# Patient Record
Sex: Female | Born: 1945 | Race: Black or African American | Hispanic: No | Marital: Married | Smoking: Former smoker
Health system: Southern US, Community
[De-identification: ages and names within clinical notes are randomized; demographics above are authoritative.]

## PROBLEM LIST (undated history)

## (undated) DIAGNOSIS — E039 Hypothyroidism, unspecified: Secondary | ICD-10-CM

## (undated) DIAGNOSIS — I499 Cardiac arrhythmia, unspecified: Secondary | ICD-10-CM

## (undated) DIAGNOSIS — J45909 Unspecified asthma, uncomplicated: Secondary | ICD-10-CM

## (undated) DIAGNOSIS — M1990 Unspecified osteoarthritis, unspecified site: Secondary | ICD-10-CM

## (undated) DIAGNOSIS — E119 Type 2 diabetes mellitus without complications: Secondary | ICD-10-CM

## (undated) HISTORY — PX: TOE SURGERY: SHX1073

## (undated) HISTORY — PX: AXILLARY SURGERY: SHX892

## (undated) HISTORY — PX: BUNIONECTOMY: SHX129

## (undated) HISTORY — PX: DG STYLOID PROCESS / TEMPORAL BONES: HXRAD918

## (undated) HISTORY — PX: SKIN GRAFT: SHX250

## (undated) HISTORY — PX: CYSTECTOMY: SUR359

## (undated) HISTORY — PX: OTHER SURGICAL HISTORY: SHX169

---

## 1996-08-13 HISTORY — PX: ABDOMINAL HYSTERECTOMY: SHX81

## 2000-12-07 ENCOUNTER — Encounter: Payer: Self-pay | Admitting: Internal Medicine

## 2000-12-07 ENCOUNTER — Emergency Department (HOSPITAL_COMMUNITY): Admission: EM | Admit: 2000-12-07 | Discharge: 2000-12-07 | Payer: Self-pay | Admitting: Internal Medicine

## 2002-10-17 ENCOUNTER — Ambulatory Visit: Admission: RE | Admit: 2002-10-17 | Discharge: 2002-10-17 | Payer: Self-pay | Admitting: Internal Medicine

## 2002-10-17 ENCOUNTER — Encounter: Payer: Self-pay | Admitting: Internal Medicine

## 2003-01-19 ENCOUNTER — Ambulatory Visit (HOSPITAL_COMMUNITY): Admission: RE | Admit: 2003-01-19 | Discharge: 2003-01-19 | Payer: Self-pay | Admitting: Internal Medicine

## 2003-01-19 ENCOUNTER — Encounter: Payer: Self-pay | Admitting: Internal Medicine

## 2003-11-13 ENCOUNTER — Emergency Department (HOSPITAL_COMMUNITY): Admission: EM | Admit: 2003-11-13 | Discharge: 2003-11-13 | Payer: Self-pay | Admitting: Emergency Medicine

## 2005-02-20 ENCOUNTER — Ambulatory Visit (HOSPITAL_COMMUNITY): Admission: RE | Admit: 2005-02-20 | Discharge: 2005-02-20 | Payer: Self-pay | Admitting: Internal Medicine

## 2005-02-26 ENCOUNTER — Emergency Department (HOSPITAL_COMMUNITY): Admission: EM | Admit: 2005-02-26 | Discharge: 2005-02-26 | Payer: Self-pay | Admitting: Emergency Medicine

## 2006-04-02 ENCOUNTER — Encounter (HOSPITAL_COMMUNITY): Admission: RE | Admit: 2006-04-02 | Discharge: 2006-05-02 | Payer: Self-pay | Admitting: Endocrinology

## 2008-11-09 ENCOUNTER — Ambulatory Visit (HOSPITAL_COMMUNITY): Admission: RE | Admit: 2008-11-09 | Discharge: 2008-11-09 | Payer: Self-pay | Admitting: Internal Medicine

## 2010-09-03 ENCOUNTER — Encounter: Payer: Self-pay | Admitting: Endocrinology

## 2010-10-31 ENCOUNTER — Other Ambulatory Visit (HOSPITAL_COMMUNITY): Payer: Self-pay | Admitting: Internal Medicine

## 2010-10-31 ENCOUNTER — Ambulatory Visit (HOSPITAL_COMMUNITY)
Admission: RE | Admit: 2010-10-31 | Discharge: 2010-10-31 | Disposition: A | Discharge status: Home / Self Care | Payer: BC Managed Care – PPO | Source: Ambulatory Visit | Attending: Internal Medicine | Admitting: Internal Medicine

## 2010-10-31 DIAGNOSIS — M25561 Pain in right knee: Secondary | ICD-10-CM

## 2010-10-31 DIAGNOSIS — Z139 Encounter for screening, unspecified: Secondary | ICD-10-CM

## 2010-10-31 DIAGNOSIS — M25569 Pain in unspecified knee: Secondary | ICD-10-CM | POA: Insufficient documentation

## 2010-10-31 DIAGNOSIS — S8990XA Unspecified injury of unspecified lower leg, initial encounter: Secondary | ICD-10-CM | POA: Insufficient documentation

## 2010-10-31 DIAGNOSIS — X500XXA Overexertion from strenuous movement or load, initial encounter: Secondary | ICD-10-CM | POA: Insufficient documentation

## 2010-11-10 ENCOUNTER — Ambulatory Visit (HOSPITAL_COMMUNITY)
Admission: RE | Admit: 2010-11-10 | Discharge: 2010-11-10 | Disposition: A | Discharge status: Home / Self Care | Payer: BC Managed Care – PPO | Source: Ambulatory Visit | Attending: Internal Medicine | Admitting: Internal Medicine

## 2010-11-10 DIAGNOSIS — Z1231 Encounter for screening mammogram for malignant neoplasm of breast: Secondary | ICD-10-CM | POA: Insufficient documentation

## 2010-11-10 DIAGNOSIS — Z139 Encounter for screening, unspecified: Secondary | ICD-10-CM

## 2012-08-11 ENCOUNTER — Other Ambulatory Visit (HOSPITAL_COMMUNITY): Payer: Self-pay | Admitting: Internal Medicine

## 2012-08-11 DIAGNOSIS — Z139 Encounter for screening, unspecified: Secondary | ICD-10-CM

## 2012-08-18 ENCOUNTER — Ambulatory Visit (HOSPITAL_COMMUNITY)
Admission: RE | Admit: 2012-08-18 | Discharge: 2012-08-18 | Disposition: A | Discharge status: Home / Self Care | Payer: BC Managed Care – PPO | Source: Ambulatory Visit | Attending: Internal Medicine | Admitting: Internal Medicine

## 2012-08-18 DIAGNOSIS — Z139 Encounter for screening, unspecified: Secondary | ICD-10-CM

## 2012-08-18 DIAGNOSIS — Z1231 Encounter for screening mammogram for malignant neoplasm of breast: Secondary | ICD-10-CM | POA: Insufficient documentation

## 2012-08-22 DIAGNOSIS — H353290 Exudative age-related macular degeneration, unspecified eye, stage unspecified: Secondary | ICD-10-CM | POA: Diagnosis not present

## 2012-09-11 ENCOUNTER — Encounter (INDEPENDENT_AMBULATORY_CARE_PROVIDER_SITE_OTHER): Payer: BC Managed Care – PPO | Admitting: Ophthalmology

## 2012-09-11 DIAGNOSIS — I10 Essential (primary) hypertension: Secondary | ICD-10-CM

## 2012-09-11 DIAGNOSIS — H35379 Puckering of macula, unspecified eye: Secondary | ICD-10-CM

## 2012-09-11 DIAGNOSIS — H2510 Age-related nuclear cataract, unspecified eye: Secondary | ICD-10-CM

## 2012-09-11 DIAGNOSIS — E1139 Type 2 diabetes mellitus with other diabetic ophthalmic complication: Secondary | ICD-10-CM

## 2012-09-11 DIAGNOSIS — E11319 Type 2 diabetes mellitus with unspecified diabetic retinopathy without macular edema: Secondary | ICD-10-CM

## 2012-09-11 DIAGNOSIS — H43819 Vitreous degeneration, unspecified eye: Secondary | ICD-10-CM

## 2012-09-11 DIAGNOSIS — H35039 Hypertensive retinopathy, unspecified eye: Secondary | ICD-10-CM

## 2012-11-30 HISTORY — PX: EYE SURGERY: SHX253

## 2013-01-09 ENCOUNTER — Ambulatory Visit (INDEPENDENT_AMBULATORY_CARE_PROVIDER_SITE_OTHER): Payer: Medicare PPO | Admitting: Ophthalmology

## 2013-01-09 DIAGNOSIS — I10 Essential (primary) hypertension: Secondary | ICD-10-CM

## 2013-01-09 DIAGNOSIS — H35379 Puckering of macula, unspecified eye: Secondary | ICD-10-CM

## 2013-01-09 DIAGNOSIS — E1139 Type 2 diabetes mellitus with other diabetic ophthalmic complication: Secondary | ICD-10-CM

## 2013-01-09 DIAGNOSIS — E11319 Type 2 diabetes mellitus with unspecified diabetic retinopathy without macular edema: Secondary | ICD-10-CM

## 2013-01-09 DIAGNOSIS — H35039 Hypertensive retinopathy, unspecified eye: Secondary | ICD-10-CM

## 2013-01-09 DIAGNOSIS — E1165 Type 2 diabetes mellitus with hyperglycemia: Secondary | ICD-10-CM

## 2013-01-09 DIAGNOSIS — H43819 Vitreous degeneration, unspecified eye: Secondary | ICD-10-CM

## 2013-01-09 NOTE — H&P (Signed)
Cheyenne Benson is an 67 y.o. female.   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  No past surgical history on file.  No family history on file. Social History:  has no tobacco, alcohol, and drug history on file.  Allergies: Allergies not on file  No prescriptions prior to admission    Review of systems otherwise negative  There were no vitals taken for this visit.  Physical exam: Mental status: oriented x3. Eyes: See eye exam associated with this date of surgery in media tab.  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

## 2013-01-12 ENCOUNTER — Encounter (HOSPITAL_COMMUNITY): Payer: Self-pay | Admitting: Pharmacy Technician

## 2013-01-19 ENCOUNTER — Encounter (HOSPITAL_COMMUNITY): Payer: Self-pay | Admitting: *Deleted

## 2013-01-21 MED ORDER — CYCLOPENTOLATE HCL 1 % OP SOLN
1.0000 [drp] | OPHTHALMIC | Status: AC | PRN
  Administered 2013-01-22 (×3): 1 [drp] via OPHTHALMIC
  Filled 2013-01-21: qty 2

## 2013-01-21 MED ORDER — PHENYLEPHRINE HCL 2.5 % OP SOLN
1.0000 [drp] | OPHTHALMIC | Status: AC | PRN
  Administered 2013-01-22 (×2): 1 [drp] via OPHTHALMIC

## 2013-01-21 MED ORDER — CEFAZOLIN SODIUM-DEXTROSE 2-3 GM-% IV SOLR
2.0000 g | INTRAVENOUS | Status: AC
  Administered 2013-01-22: 2 g via INTRAVENOUS
  Filled 2013-01-21: qty 50

## 2013-01-21 MED ORDER — GATIFLOXACIN 0.5 % OP SOLN
1.0000 [drp] | OPHTHALMIC | Status: AC | PRN
  Administered 2013-01-22 (×3): 1 [drp] via OPHTHALMIC
  Filled 2013-01-21: qty 2.5

## 2013-01-21 MED ORDER — TROPICAMIDE 1 % OP SOLN
1.0000 [drp] | OPHTHALMIC | Status: AC | PRN
  Administered 2013-01-22 (×3): 1 [drp] via OPHTHALMIC
  Filled 2013-01-21: qty 3

## 2013-01-22 ENCOUNTER — Ambulatory Visit (HOSPITAL_COMMUNITY): Payer: Medicare PPO | Admitting: Anesthesiology

## 2013-01-22 ENCOUNTER — Encounter (HOSPITAL_COMMUNITY): Payer: Self-pay | Admitting: Anesthesiology

## 2013-01-22 ENCOUNTER — Ambulatory Visit (HOSPITAL_COMMUNITY): Payer: Medicare PPO

## 2013-01-22 ENCOUNTER — Encounter (HOSPITAL_COMMUNITY)
Admission: RE | Disposition: A | Discharge status: Home / Self Care | Payer: Self-pay | Source: Ambulatory Visit | Attending: Ophthalmology

## 2013-01-22 ENCOUNTER — Ambulatory Visit (HOSPITAL_COMMUNITY)
Admission: RE | Admit: 2013-01-22 | Discharge: 2013-01-23 | Disposition: A | Discharge status: Home / Self Care | Payer: Medicare PPO | Source: Ambulatory Visit | Attending: Ophthalmology | Admitting: Ophthalmology

## 2013-01-22 DIAGNOSIS — H35379 Puckering of macula, unspecified eye: Secondary | ICD-10-CM

## 2013-01-22 DIAGNOSIS — H35349 Macular cyst, hole, or pseudohole, unspecified eye: Secondary | ICD-10-CM

## 2013-01-22 DIAGNOSIS — H35341 Macular cyst, hole, or pseudohole, right eye: Secondary | ICD-10-CM

## 2013-01-22 DIAGNOSIS — H35371 Puckering of macula, right eye: Secondary | ICD-10-CM | POA: Diagnosis present

## 2013-01-22 DIAGNOSIS — E119 Type 2 diabetes mellitus without complications: Secondary | ICD-10-CM | POA: Insufficient documentation

## 2013-01-22 HISTORY — PX: MEMBRANE PEEL: SHX5967

## 2013-01-22 HISTORY — PX: 25 GAUGE PARS PLANA VITRECTOMY WITH 20 GAUGE MVR PORT FOR MACULAR HOLE: SHX6096

## 2013-01-22 HISTORY — PX: SERUM PATCH: SHX6091

## 2013-01-22 HISTORY — PX: GAS/FLUID EXCHANGE: SHX5334

## 2013-01-22 HISTORY — DX: Cardiac arrhythmia, unspecified: I49.9

## 2013-01-22 HISTORY — DX: Type 2 diabetes mellitus without complications: E11.9

## 2013-01-22 HISTORY — DX: Hypothyroidism, unspecified: E03.9

## 2013-01-22 HISTORY — PX: LASER PHOTO ABLATION: SHX5942

## 2013-01-22 HISTORY — DX: Unspecified asthma, uncomplicated: J45.909

## 2013-01-22 HISTORY — DX: Unspecified osteoarthritis, unspecified site: M19.90

## 2013-01-22 LAB — CBC
Hemoglobin: 12.3 g/dL (ref 12.0–15.0)
MCHC: 34.6 g/dL (ref 30.0–36.0)
Platelets: 257 10*3/uL (ref 150–400)
RDW: 16.4 % — ABNORMAL HIGH (ref 11.5–15.5)

## 2013-01-22 LAB — GLUCOSE, CAPILLARY
Glucose-Capillary: 99 mg/dL (ref 70–99)
Glucose-Capillary: 111 mg/dL — ABNORMAL HIGH (ref 70–99)
Glucose-Capillary: 139 mg/dL — ABNORMAL HIGH (ref 70–99)

## 2013-01-22 LAB — BASIC METABOLIC PANEL
GFR calc Af Amer: >90 mL/min (ref >90)
GFR calc non Af Amer: 89 mL/min — ABNORMAL LOW (ref >90)
Glucose, Bld: 113 mg/dL — ABNORMAL HIGH (ref 70–99)
Potassium: 3.8 mEq/L (ref 3.5–5.1)
Sodium: 139 mEq/L (ref 135–145)

## 2013-01-22 LAB — SURGICAL PCR SCREEN
MRSA, PCR: NEGATIVE
Staphylococcus aureus: NEGATIVE

## 2013-01-22 SURGERY — 25 GAUGE PARS PLANA VITRECTOMY WITH 20 GAUGE MVR PORT FOR MACULAR HOLE
Anesthesia: General | Site: Eye | Laterality: Right

## 2013-01-22 MED ORDER — ALBUTEROL SULFATE HFA 108 (90 BASE) MCG/ACT IN AERS: 2.0000 | INHALATION_SPRAY | Freq: Four times a day (QID) | RESPIRATORY_TRACT | Status: DC | PRN

## 2013-01-22 MED ORDER — EPHEDRINE SULFATE 50 MG/ML IJ SOLN
INTRAMUSCULAR | Status: DC | PRN
  Administered 2013-01-22: 15 mg via INTRAVENOUS
  Administered 2013-01-22 (×2): 10 mg via INTRAVENOUS
  Administered 2013-01-22: 15 mg via INTRAVENOUS
  Administered 2013-01-22: 10 mg via INTRAVENOUS

## 2013-01-22 MED ORDER — BSS IO SOLN
INTRAOCULAR | Status: AC
  Filled 2013-01-22: qty 15

## 2013-01-22 MED ORDER — ONDANSETRON HCL 4 MG/2ML IJ SOLN
INTRAMUSCULAR | Status: DC | PRN
  Administered 2013-01-22: 4 mg via INTRAVENOUS

## 2013-01-22 MED ORDER — SODIUM HYALURONATE 10 MG/ML IO SOLN
INTRAOCULAR | Status: DC | PRN
  Administered 2013-01-22: 0.85 mL via INTRAOCULAR

## 2013-01-22 MED ORDER — ACETAZOLAMIDE SODIUM 500 MG IJ SOLR
INTRAMUSCULAR | Status: AC
  Filled 2013-01-22: qty 500

## 2013-01-22 MED ORDER — BRIMONIDINE TARTRATE 0.2 % OP SOLN
1.0000 [drp] | Freq: Two times a day (BID) | OPHTHALMIC | Status: DC
  Filled 2013-01-22 (×2): qty 5

## 2013-01-22 MED ORDER — PHENYLEPHRINE HCL 2.5 % OP SOLN
OPHTHALMIC | Status: AC
  Administered 2013-01-22: 1 [drp]
  Filled 2013-01-22: qty 2

## 2013-01-22 MED ORDER — ATROPINE SULFATE 1 % OP SOLN
OPHTHALMIC | Status: AC
  Filled 2013-01-22: qty 2

## 2013-01-22 MED ORDER — PHENYLEPHRINE HCL 10 MG/ML IJ SOLN
INTRAMUSCULAR | Status: DC | PRN
  Administered 2013-01-22: 80 ug via INTRAVENOUS

## 2013-01-22 MED ORDER — ROCURONIUM BROMIDE 100 MG/10ML IV SOLN
INTRAVENOUS | Status: DC | PRN
  Administered 2013-01-22: 50 mg via INTRAVENOUS

## 2013-01-22 MED ORDER — INSULIN ASPART 100 UNIT/ML ~~LOC~~ SOLN: 0.0000 [IU] | SUBCUTANEOUS | Status: DC

## 2013-01-22 MED ORDER — HYALURONIDASE HUMAN 150 UNIT/ML IJ SOLN
INTRAMUSCULAR | Status: AC
  Filled 2013-01-22: qty 1

## 2013-01-22 MED ORDER — METFORMIN HCL 500 MG PO TABS
500.0000 mg | ORAL_TABLET | Freq: Two times a day (BID) | ORAL | Status: DC
  Administered 2013-01-22: 500 mg via ORAL
  Filled 2013-01-22 (×3): qty 1

## 2013-01-22 MED ORDER — PHENTERMINE-TOPIRAMATE ER 7.5-46 MG PO CP24: 1.0000 | ORAL_CAPSULE | Freq: Every day | ORAL | Status: DC

## 2013-01-22 MED ORDER — LATANOPROST 0.005 % OP SOLN
1.0000 [drp] | Freq: Every day | OPHTHALMIC | Status: DC
  Filled 2013-01-22 (×3): qty 2.5

## 2013-01-22 MED ORDER — SODIUM CHLORIDE 0.9 % IJ SOLN
INTRAMUSCULAR | Status: DC | PRN
  Administered 2013-01-22: 13:00:00

## 2013-01-22 MED ORDER — METOPROLOL TARTRATE 25 MG PO TABS
25.0000 mg | ORAL_TABLET | Freq: Every day | ORAL | Status: DC
  Filled 2013-01-22: qty 1

## 2013-01-22 MED ORDER — BACITRACIN-POLYMYXIN B 500-10000 UNIT/GM OP OINT
1.0000 "application " | TOPICAL_OINTMENT | Freq: Four times a day (QID) | OPHTHALMIC | Status: DC
  Filled 2013-01-22: qty 3.5

## 2013-01-22 MED ORDER — POLYMYXIN B SULFATE 500000 UNITS IJ SOLR
INTRAMUSCULAR | Status: AC
  Filled 2013-01-22: qty 1

## 2013-01-22 MED ORDER — DOCUSATE SODIUM 100 MG PO CAPS
100.0000 mg | ORAL_CAPSULE | Freq: Two times a day (BID) | ORAL | Status: DC
  Administered 2013-01-22: 100 mg via ORAL
  Filled 2013-01-22: qty 1

## 2013-01-22 MED ORDER — HYDROCODONE-ACETAMINOPHEN 5-325 MG PO TABS: 1.0000 | ORAL_TABLET | ORAL | Status: DC | PRN

## 2013-01-22 MED ORDER — HYDROCHLOROTHIAZIDE 12.5 MG PO CAPS
12.5000 mg | ORAL_CAPSULE | Freq: Every day | ORAL | Status: DC
  Administered 2013-01-22: 12.5 mg via ORAL
  Filled 2013-01-22 (×2): qty 1

## 2013-01-22 MED ORDER — NEOSTIGMINE METHYLSULFATE 1 MG/ML IJ SOLN
INTRAMUSCULAR | Status: DC | PRN
  Administered 2013-01-22: 4 mg via INTRAVENOUS

## 2013-01-22 MED ORDER — SODIUM HYALURONATE 10 MG/ML IO SOLN
INTRAOCULAR | Status: AC
  Filled 2013-01-22: qty 0.85

## 2013-01-22 MED ORDER — ATROPINE SULFATE 1 % OP SOLN
OPHTHALMIC | Status: DC | PRN
  Administered 2013-01-22: 2 [drp] via OPHTHALMIC

## 2013-01-22 MED ORDER — MUPIROCIN 2 % EX OINT
TOPICAL_OINTMENT | CUTANEOUS | Status: AC
  Filled 2013-01-22: qty 22

## 2013-01-22 MED ORDER — 0.9 % SODIUM CHLORIDE (POUR BTL) OPTIME
TOPICAL | Status: DC | PRN
  Administered 2013-01-22: 1000 mL

## 2013-01-22 MED ORDER — HYDROMORPHONE HCL PF 1 MG/ML IJ SOLN
0.2500 mg | INTRAMUSCULAR | Status: DC | PRN
  Administered 2013-01-22: 0.5 mg via INTRAVENOUS
  Administered 2013-01-22: 0.25 mg via INTRAVENOUS

## 2013-01-22 MED ORDER — GLYCOPYRROLATE 0.2 MG/ML IJ SOLN
INTRAMUSCULAR | Status: DC | PRN
  Administered 2013-01-22: 0.2 mg via INTRAVENOUS
  Administered 2013-01-22: .8 mg via INTRAVENOUS

## 2013-01-22 MED ORDER — MORPHINE SULFATE 2 MG/ML IJ SOLN
1.0000 mg | INTRAMUSCULAR | Status: DC | PRN
  Administered 2013-01-22: 1 mg via INTRAVENOUS
  Filled 2013-01-22: qty 1

## 2013-01-22 MED ORDER — BACITRACIN-POLYMYXIN B 500-10000 UNIT/GM OP OINT
TOPICAL_OINTMENT | OPHTHALMIC | Status: DC | PRN
  Administered 2013-01-22: 1 via OPHTHALMIC

## 2013-01-22 MED ORDER — OXYCODONE HCL 5 MG/5ML PO SOLN: 5.0000 mg | Freq: Once | ORAL | Status: DC | PRN

## 2013-01-22 MED ORDER — MOMETASONE FURO-FORMOTEROL FUM 100-5 MCG/ACT IN AERO
2.0000 | INHALATION_SPRAY | Freq: Two times a day (BID) | RESPIRATORY_TRACT | Status: DC
  Filled 2013-01-22: qty 8.8

## 2013-01-22 MED ORDER — SODIUM CHLORIDE 0.45 % IV SOLN
INTRAVENOUS | Status: DC
  Administered 2013-01-22: 20 mL via INTRAVENOUS

## 2013-01-22 MED ORDER — EPINEPHRINE HCL 1 MG/ML IJ SOLN
INTRAMUSCULAR | Status: AC
  Filled 2013-01-22: qty 1

## 2013-01-22 MED ORDER — MEPERIDINE HCL 25 MG/ML IJ SOLN: 6.2500 mg | INTRAMUSCULAR | Status: DC | PRN

## 2013-01-22 MED ORDER — MINERAL OIL LIGHT 100 % EX OIL
TOPICAL_OIL | CUTANEOUS | Status: AC
  Filled 2013-01-22: qty 25

## 2013-01-22 MED ORDER — LISINOPRIL 10 MG PO TABS
10.0000 mg | ORAL_TABLET | Freq: Every day | ORAL | Status: DC
  Administered 2013-01-22: 10 mg via ORAL
  Filled 2013-01-22 (×2): qty 1

## 2013-01-22 MED ORDER — PROPOFOL 10 MG/ML IV BOLUS
INTRAVENOUS | Status: DC | PRN
  Administered 2013-01-22: 150 mg via INTRAVENOUS

## 2013-01-22 MED ORDER — LISINOPRIL-HYDROCHLOROTHIAZIDE 10-12.5 MG PO TABS: 1.0000 | ORAL_TABLET | Freq: Every day | ORAL | Status: DC

## 2013-01-22 MED ORDER — BUPIVACAINE HCL (PF) 0.75 % IJ SOLN
INTRAMUSCULAR | Status: AC
  Filled 2013-01-22: qty 10

## 2013-01-22 MED ORDER — LIDOCAINE HCL (CARDIAC) 20 MG/ML IV SOLN
INTRAVENOUS | Status: DC | PRN
  Administered 2013-01-22: 100 mg via INTRAVENOUS

## 2013-01-22 MED ORDER — ACETAZOLAMIDE SODIUM 500 MG IJ SOLR
500.0000 mg | Freq: Once | INTRAMUSCULAR | Status: AC
  Administered 2013-01-23: 500 mg via INTRAVENOUS
  Filled 2013-01-22: qty 500

## 2013-01-22 MED ORDER — EPINEPHRINE HCL 1 MG/ML IJ SOLN
INTRAOCULAR | Status: DC | PRN
  Administered 2013-01-22: 13:00:00

## 2013-01-22 MED ORDER — ONDANSETRON HCL 4 MG/2ML IJ SOLN: 4.0000 mg | Freq: Four times a day (QID) | INTRAMUSCULAR | Status: DC | PRN

## 2013-01-22 MED ORDER — MUPIROCIN 2 % EX OINT
TOPICAL_OINTMENT | Freq: Two times a day (BID) | CUTANEOUS | Status: DC
  Administered 2013-01-22: 09:00:00 via NASAL

## 2013-01-22 MED ORDER — GATIFLOXACIN 0.5 % OP SOLN
1.0000 [drp] | Freq: Four times a day (QID) | OPHTHALMIC | Status: DC
  Filled 2013-01-22 (×2): qty 2.5

## 2013-01-22 MED ORDER — PREDNISOLONE ACETATE 1 % OP SUSP
1.0000 [drp] | Freq: Four times a day (QID) | OPHTHALMIC | Status: DC
  Filled 2013-01-22: qty 5
  Filled 2013-01-22: qty 1

## 2013-01-22 MED ORDER — OXYCODONE HCL 5 MG PO TABS: 5.0000 mg | ORAL_TABLET | Freq: Once | ORAL | Status: DC | PRN

## 2013-01-22 MED ORDER — DEXAMETHASONE SODIUM PHOSPHATE 10 MG/ML IJ SOLN
INTRAMUSCULAR | Status: AC
  Filled 2013-01-22: qty 1

## 2013-01-22 MED ORDER — FENTANYL CITRATE 0.05 MG/ML IJ SOLN
INTRAMUSCULAR | Status: DC | PRN
  Administered 2013-01-22: 150 ug via INTRAVENOUS

## 2013-01-22 MED ORDER — HYDROMORPHONE HCL PF 1 MG/ML IJ SOLN
INTRAMUSCULAR | Status: AC
  Filled 2013-01-22: qty 1

## 2013-01-22 MED ORDER — LIDOCAINE HCL 2 % IJ SOLN
INTRAMUSCULAR | Status: AC
  Filled 2013-01-22: qty 20

## 2013-01-22 MED ORDER — HYPROMELLOSE (GONIOSCOPIC) 2.5 % OP SOLN
OPHTHALMIC | Status: AC
  Filled 2013-01-22: qty 15

## 2013-01-22 MED ORDER — TETRACAINE HCL 0.5 % OP SOLN
2.0000 [drp] | Freq: Once | OPHTHALMIC | Status: DC
  Filled 2013-01-22 (×2): qty 2

## 2013-01-22 MED ORDER — GENTAMICIN SULFATE 40 MG/ML IJ SOLN
INTRAMUSCULAR | Status: AC
  Filled 2013-01-22: qty 2

## 2013-01-22 MED ORDER — DEXAMETHASONE SODIUM PHOSPHATE 10 MG/ML IJ SOLN
INTRAMUSCULAR | Status: DC | PRN
  Administered 2013-01-22: 10 mg

## 2013-01-22 MED ORDER — ONDANSETRON HCL 4 MG/2ML IJ SOLN: 4.0000 mg | Freq: Once | INTRAMUSCULAR | Status: DC | PRN

## 2013-01-22 MED ORDER — SODIUM CHLORIDE 0.9 % IV SOLN
INTRAVENOUS | Status: DC | PRN
  Administered 2013-01-22 (×2): via INTRAVENOUS

## 2013-01-22 MED ORDER — TRIAMCINOLONE ACETONIDE 40 MG/ML IJ SUSP
INTRAMUSCULAR | Status: AC
  Filled 2013-01-22: qty 5

## 2013-01-22 MED ORDER — ATORVASTATIN CALCIUM 20 MG PO TABS
20.0000 mg | ORAL_TABLET | Freq: Every day | ORAL | Status: DC
  Filled 2013-01-22 (×2): qty 1

## 2013-01-22 MED ORDER — ACETAMINOPHEN 325 MG PO TABS: 325.0000 mg | ORAL_TABLET | ORAL | Status: DC | PRN

## 2013-01-22 MED ORDER — BACITRACIN-POLYMYXIN B 500-10000 UNIT/GM OP OINT
TOPICAL_OINTMENT | OPHTHALMIC | Status: AC
  Filled 2013-01-22: qty 3.5

## 2013-01-22 MED ORDER — LEVOTHYROXINE SODIUM 125 MCG PO TABS
125.0000 ug | ORAL_TABLET | Freq: Every day | ORAL | Status: DC
  Filled 2013-01-22: qty 1

## 2013-01-22 MED ORDER — TEMAZEPAM 15 MG PO CAPS: 15.0000 mg | ORAL_CAPSULE | Freq: Every evening | ORAL | Status: DC | PRN

## 2013-01-22 MED ORDER — BUPIVACAINE HCL (PF) 0.75 % IJ SOLN
INTRAMUSCULAR | Status: DC | PRN
  Administered 2013-01-22: 10 mL

## 2013-01-22 MED ORDER — MAGNESIUM HYDROXIDE 400 MG/5ML PO SUSP: 15.0000 mL | Freq: Four times a day (QID) | ORAL | Status: DC | PRN

## 2013-01-22 SURGICAL SUPPLY — 60 items
APPLICATOR DR MATTHEWS STRL (MISCELLANEOUS) IMPLANT
BLADE EYE CATARACT 19 1.4 BEAV (BLADE) IMPLANT
BLADE MVR KNIFE 19G (BLADE) IMPLANT
BLADE MVR KNIFE 20G (BLADE) ×3 IMPLANT
CANNULA ANT CHAM MAIN (OPHTHALMIC RELATED) IMPLANT
CANNULA FLEX TIP 25G (CANNULA) ×3 IMPLANT
CANNULA SUBRETINAL FLUID 20G (BLADE) ×3 IMPLANT
CLOTH BEACON ORANGE TIMEOUT ST (SAFETY) ×3 IMPLANT
CORDS BIPOLAR (ELECTRODE) ×3 IMPLANT
COTTONBALL LRG STERILE PKG (GAUZE/BANDAGES/DRESSINGS) ×9 IMPLANT
DRAPE INCISE 51X51 W/FILM STRL (DRAPES) ×3 IMPLANT
DRAPE OPHTHALMIC 77X100 STRL (CUSTOM PROCEDURE TRAY) ×3 IMPLANT
EAGLE VIT/RET MICRO PIC 168 25 (MISCELLANEOUS) IMPLANT
ERASER HMR WETFIELD 23G BP (MISCELLANEOUS) IMPLANT
FILTER BLUE MILLIPORE (MISCELLANEOUS) ×3 IMPLANT
FILTER STRAW FLUID ASPIR (MISCELLANEOUS) ×3 IMPLANT
GAS OPHTHALMIC (MISCELLANEOUS) ×3 IMPLANT
GLOVE SS BIOGEL STRL SZ 6.5 (GLOVE) ×4 IMPLANT
GLOVE SS BIOGEL STRL SZ 7 (GLOVE) ×2 IMPLANT
GLOVE SUPERSENSE BIOGEL SZ 6.5 (GLOVE) ×2
GLOVE SUPERSENSE BIOGEL SZ 7 (GLOVE) ×1
GLOVE SURG 8.5 LATEX PF (GLOVE) ×3 IMPLANT
GOWN STRL NON-REIN LRG LVL3 (GOWN DISPOSABLE) ×9 IMPLANT
ILLUMINATOR CHOW PICK 25GA (MISCELLANEOUS) ×3 IMPLANT
KIT BASIN OR (CUSTOM PROCEDURE TRAY) ×3 IMPLANT
KIT ROOM TURNOVER OR (KITS) ×3 IMPLANT
KNIFE CRESCENT 1.75 EDGEAHEAD (BLADE) IMPLANT
KNIFE GRIESHABER SHARP 2.5MM (MISCELLANEOUS) IMPLANT
MASK EYE SHIELD (GAUZE/BANDAGES/DRESSINGS) ×3 IMPLANT
NEEDLE 18GX1X1/2 (RX/OR ONLY) (NEEDLE) ×3 IMPLANT
NEEDLE 25GX 5/8IN NON SAFETY (NEEDLE) ×3 IMPLANT
NEEDLE 27GAX1X1/2 (NEEDLE) IMPLANT
NEEDLE HYPO 30X.5 LL (NEEDLE) ×9 IMPLANT
NS IRRIG 1000ML POUR BTL (IV SOLUTION) ×3 IMPLANT
PACK VITRECTOMY CUSTOM (CUSTOM PROCEDURE TRAY) ×3 IMPLANT
PAD ARMBOARD 7.5X6 YLW CONV (MISCELLANEOUS) ×6 IMPLANT
PAD EYE OVAL STERILE LF (GAUZE/BANDAGES/DRESSINGS) ×3 IMPLANT
PAK VITRECTOMY PIK 25 GA (OPHTHALMIC RELATED) IMPLANT
PROBE DIRECTIONAL LASER (MISCELLANEOUS) IMPLANT
REPL STRA BRUSH NEEDLE (NEEDLE) ×3 IMPLANT
RESERVOIR BACK FLUSH (MISCELLANEOUS) ×3 IMPLANT
ROLLS DENTAL (MISCELLANEOUS) ×6 IMPLANT
SCRAPER DIAMOND 25GA (OPHTHALMIC RELATED) IMPLANT
SCRAPER DIAMOND DUST MEMBRANE (MISCELLANEOUS) ×3 IMPLANT
SPONGE SURGIFOAM ABS GEL 12-7 (HEMOSTASIS) ×3 IMPLANT
STOPCOCK 4 WAY LG BORE MALE ST (IV SETS) ×3 IMPLANT
SUT CHROMIC 7 0 TG140 8 (SUTURE) IMPLANT
SUT ETHILON 9 0 TG140 8 (SUTURE) ×3 IMPLANT
SUT SILK 4 0 RB 1 (SUTURE) IMPLANT
SYR 20CC LL (SYRINGE) ×3 IMPLANT
SYR 50ML LL SCALE MARK (SYRINGE) ×3 IMPLANT
SYR 5ML LL (SYRINGE) IMPLANT
SYR BULB 3OZ (MISCELLANEOUS) ×3 IMPLANT
SYR TB 1ML LUER SLIP (SYRINGE) ×3 IMPLANT
SYRINGE 10CC LL (SYRINGE) ×3 IMPLANT
TAPE SURG TRANSPORE 1 IN (GAUZE/BANDAGES/DRESSINGS) ×2 IMPLANT
TAPE SURGICAL TRANSPORE 1 IN (GAUZE/BANDAGES/DRESSINGS) ×1
TOWEL OR 17X24 6PK STRL BLUE (TOWEL DISPOSABLE) ×9 IMPLANT
WATER STERILE IRR 1000ML POUR (IV SOLUTION) ×3 IMPLANT
WIPE INSTRUMENT VISIWIPE 73X73 (MISCELLANEOUS) ×3 IMPLANT

## 2013-01-22 NOTE — Anesthesia Postprocedure Evaluation (Signed)
Anesthesia Post Note  Patient: Cheyenne Benson  Procedure(s) Performed: Procedure(s) (LRB): 25 GAUGE PARS PLANA VITRECTOMY WITH 20 GAUGE MVR PORT FOR MACULAR HOLE RIGHT EYE  (Right) GAS/FLUID EXCHANGE (Right) SERUM PATCH (Right) MEMBRANE PEEL (Right) LASER PHOTO ABLATION (Right)  Anesthesia type: general  Patient location: PACU  Post pain: Pain level controlled  Post assessment: Patient's Cardiovascular Status Stable  Last Vitals:  Filed Vitals:   01/22/13 1400  BP: 112/50  Pulse:   Temp:   Resp: 11    Post vital signs: Reviewed and stable  Level of consciousness: sedated  Complications: No apparent anesthesia complications

## 2013-01-22 NOTE — H&P (Signed)
I examined the patient today and there is no change in the medical status 

## 2013-01-22 NOTE — Transfer of Care (Signed)
Immediate Anesthesia Transfer of Care Note  Patient: Cheyenne Benson  Procedure(s) Performed: Procedure(s) with comments: 25 GAUGE PARS PLANA VITRECTOMY WITH 20 GAUGE MVR PORT FOR MACULAR HOLE RIGHT EYE  (Right) GAS/FLUID EXCHANGE (Right) - C3F8 SERUM PATCH (Right) MEMBRANE PEEL (Right) LASER PHOTO ABLATION (Right) - Headscope  Patient Location: PACU  Anesthesia Type:General  Level of Consciousness: awake, alert  and oriented  Airway & Oxygen Therapy: Patient Spontanous Breathing and Patient connected to nasal cannula oxygen  Post-op Assessment: Report given to PACU RN, Post -op Vital signs reviewed and stable and Patient moving all extremities X 4  Post vital signs: Reviewed and stable  Complications: No apparent anesthesia complications

## 2013-01-22 NOTE — Anesthesia Procedure Notes (Signed)
Procedure Name: Intubation Date/Time: 01/22/2013 11:49 AM Performed by: Carmela Rima Pre-anesthesia Checklist: Patient identified, Emergency Drugs available, Suction available, Patient being monitored and Timeout performed Patient Re-evaluated:Patient Re-evaluated prior to inductionOxygen Delivery Method: Circle system utilized Preoxygenation: Pre-oxygenation with 100% oxygen Intubation Type: IV induction Ventilation: Mask ventilation without difficulty and Oral airway inserted - appropriate to patient size Laryngoscope Size: Mac and 4 Grade View: Grade I Tube type: Oral Tube size: 7.5 mm Number of attempts: 1 Airway Equipment and Method: Stylet Placement Confirmation: ETT inserted through vocal cords under direct vision,  positive ETCO2 and breath sounds checked- equal and bilateral Secured at: 21 cm Tube secured with: Tape Dental Injury: Teeth and Oropharynx as per pre-operative assessment

## 2013-01-22 NOTE — Brief Op Note (Signed)
Brief Operative note   Preoperative diagnosis:  Pre-Op Diagnosis Codes:    * Macular puckering of retina, right [362.56] Postoperative diagnosis  Post-Op Diagnosis Codes:    * Macular puckering of retina, right [362.56]  Procedures: Membrane peel and macular hole repair right eye  Surgeon:  Sherrie George, MD...  Assistant:  Rosalie Doctor SA    Anesthesia: General  Specimen: none  Estimated blood loss:  1cc  Complications: none  Patient sent to PACU in good condition  Composed by Sherrie George MD  Dictation number: [id number]

## 2013-01-22 NOTE — Anesthesia Preprocedure Evaluation (Addendum)
Anesthesia Evaluation  Patient identified by MRN, date of birth, ID band  Reviewed: Allergy & Precautions, H&P , NPO status , Patient's Chart, lab work & pertinent test results, reviewed documented beta blocker date and time   Airway Mallampati: II TM Distance: >3 FB Neck ROM: full    Dental  (+) Teeth Intact and Dental Advidsory Given   Pulmonary asthma ,          Cardiovascular + dysrhythmias Supra Ventricular Tachycardia     Neuro/Psych    GI/Hepatic   Endo/Other  diabetes, Well Controlled, Type 2, Oral Hypoglycemic AgentsHypothyroidism   Renal/GU      Musculoskeletal   Abdominal   Peds  Hematology   Anesthesia Other Findings   Reproductive/Obstetrics                           Anesthesia Physical Anesthesia Plan  ASA: III  Anesthesia Plan:    Post-op Pain Management:    Induction:   Airway Management Planned:   Additional Equipment:   Intra-op Plan:   Post-operative Plan:   Informed Consent:   Dental Advisory Given  Plan Discussed with: Anesthesiologist, CRNA and Surgeon  Anesthesia Plan Comments:        Anesthesia Quick Evaluation

## 2013-01-22 NOTE — Preoperative (Signed)
Beta Blockers   Reason not to administer Beta Blockers:Not Applicable 

## 2013-01-23 DIAGNOSIS — H35371 Puckering of macula, right eye: Secondary | ICD-10-CM | POA: Diagnosis present

## 2013-01-23 DIAGNOSIS — H35349 Macular cyst, hole, or pseudohole, unspecified eye: Secondary | ICD-10-CM | POA: Diagnosis present

## 2013-01-23 LAB — GLUCOSE, CAPILLARY
Glucose-Capillary: 126 mg/dL — ABNORMAL HIGH (ref 70–99)
Glucose-Capillary: 148 mg/dL — ABNORMAL HIGH (ref 70–99)

## 2013-01-23 MED ORDER — BRIMONIDINE TARTRATE 0.2 % OP SOLN: 1.0000 [drp] | Freq: Two times a day (BID) | OPHTHALMIC | Status: DC

## 2013-01-23 MED ORDER — PREDNISOLONE ACETATE 1 % OP SUSP: 1.0000 [drp] | Freq: Four times a day (QID) | OPHTHALMIC | Status: DC

## 2013-01-23 MED ORDER — BACITRACIN-POLYMYXIN B 500-10000 UNIT/GM OP OINT: 1.0000 "application " | TOPICAL_OINTMENT | Freq: Four times a day (QID) | OPHTHALMIC | Status: DC

## 2013-01-23 MED ORDER — GATIFLOXACIN 0.5 % OP SOLN: 1.0000 [drp] | Freq: Four times a day (QID) | OPHTHALMIC | Status: DC

## 2013-01-23 MED ORDER — HYDROCODONE-ACETAMINOPHEN 5-325 MG PO TABS: 1.0000 | ORAL_TABLET | ORAL | Status: DC | PRN

## 2013-01-23 NOTE — Discharge Summary (Signed)
Discharge summary not needed on [facility] patients per medical records. 

## 2013-01-23 NOTE — Op Note (Signed)
Cheyenne Benson, Cheyenne Benson               ACCOUNT NO.:  [id number]  MEDICAL RECORD NO.:  [id number]  LOCATION:  [id number]                        FACILITY:  [facility]  PHYSICIAN:  Beulah Gandy. Ashley Royalty, M.D. DATE OF BIRTH:  12-30-1945  DATE OF PROCEDURE:  01/22/2013 DATE OF DISCHARGE:                              OPERATIVE REPORT   ADMISSION DIAGNOSIS:  Preretinal fibrosis and macular hole, right eye.  PROCEDURES: 1. Pars plana vitrectomy. 2. Retinal photocoagulation. 3. Removal of membrane peel. 4. Serum patch. 5. Gas-fluid exchange, all in the right eye.  SURGEON:  Beulah Gandy. Ashley Royalty, MD  ASSISTANT:  Rosalie Doctor, SA  ANESTHESIA:  General.  DETAILS:  Usual prep and drape, 25-gauge trocars placed at 8 and 10 o'clock with 3-layered MVR incision at 2 o'clock.  Contact lens ring was anchored into place at 6 and 12 o'clock.  Provisc placed on the cornea. The magnifying contact lens was placed.  The pars plana vitrectomy was begun at the pseudophakos.  The vitrectomy was carried posteriorly down to the macular surface where the macula was thrown into folds.  The preretinal membrane was engaged with the [facility] pick with 25-gauge forceps and the lighted pick until it was stripped totally across the macular region.  The diamond-dusted membrane scraper was used to remove portions of the membrane and to straighten the macular region out.  It was apparent that there was a shallow macular hole and therefore macular hole protocols were begun.  The vitrectomy was carried out to the mid periphery where all surface membranes were removed.  The vitrectomy was carried into the far periphery.  With the BIOM viewing system down to the vitreous base for 360 degrees.  The indirect ophthalmoscope laser was moved into place, 685 burns placed around the retinal periphery. The power of 400 mW, 1000 microns each and 0.1 second each.  The macular region was inspected.  It was thrown into folds from the previous membrane.   There was some flattening of folds with the diamond dusted membrane scraper.  A total gas-fluid exchange was performed.  The sufficient time was allowed for additional fluid to track down the walls of the eye and collect in the posterior segment.  Then, additional fluid was removed with a [facility] brush.  Serum patch was delivered. C3F8 12% was exchanged for intravitreal gas.  The instruments were removed from the eye and the wounds were tested.  They were found to be secured.  The scleral wound was closed with one 9-0 nylon suture.  The conjunctiva was closed with wet-field cautery.  Polymyxin and gentamicin were irrigated into tenon space.  Atropine solution was applied. Marcaine was injected around the globe for postop pain.  Closing pressure was 10 with a [place] tonometer.  Polysporin ophthalmic ointment, patch and shield were placed.  The patient is awake and taken to the recovery in satisfactory condition.  COMPLICATIONS:  None.  DURATION:  1 hour 30 minutes.     Beulah Gandy. Ashley Royalty, M.D.     JDM/MEDQ  D:  01/22/2013  T:  01/23/2013  Job:  [id number]

## 2013-01-23 NOTE — Progress Notes (Signed)
DC home with husband, verbally understood DC instructions, no questions asked. 

## 2013-01-23 NOTE — Progress Notes (Signed)
01/23/2013, 6:40 AM  Mental Status:  Awake, Alert, Oriented  Anterior segment: Cornea  Clear    Anterior Chamber Clear    Lens:    IOL  Intra Ocular Pressure 21 mmHg with Tonopen  Vitreous: Clear 95%gas bubble  Retina:  Attached Good laser reaction  Impression: Excellent result Retina attached   Final Diagnosis: Macular hole, macular pucker right eye  Plan: start post operative eye drops.  Discharge to home.  Give post operative instructions  Sherrie George 01/23/2013, 6:40 AM

## 2013-01-27 ENCOUNTER — Encounter (HOSPITAL_COMMUNITY): Payer: Self-pay | Admitting: Ophthalmology

## 2013-01-29 ENCOUNTER — Inpatient Hospital Stay (INDEPENDENT_AMBULATORY_CARE_PROVIDER_SITE_OTHER): Payer: Medicare PPO | Admitting: Ophthalmology

## 2013-01-29 DIAGNOSIS — H35349 Macular cyst, hole, or pseudohole, unspecified eye: Secondary | ICD-10-CM

## 2013-02-19 ENCOUNTER — Encounter (INDEPENDENT_AMBULATORY_CARE_PROVIDER_SITE_OTHER): Payer: Medicare PPO | Admitting: Ophthalmology

## 2013-02-19 DIAGNOSIS — H35349 Macular cyst, hole, or pseudohole, unspecified eye: Secondary | ICD-10-CM

## 2013-03-16 DIAGNOSIS — E018 Other iodine-deficiency related thyroid disorders and allied conditions: Secondary | ICD-10-CM | POA: Diagnosis not present

## 2013-03-16 DIAGNOSIS — E119 Type 2 diabetes mellitus without complications: Secondary | ICD-10-CM | POA: Diagnosis not present

## 2013-03-23 DIAGNOSIS — E018 Other iodine-deficiency related thyroid disorders and allied conditions: Secondary | ICD-10-CM | POA: Diagnosis not present

## 2013-03-23 DIAGNOSIS — E119 Type 2 diabetes mellitus without complications: Secondary | ICD-10-CM | POA: Diagnosis not present

## 2013-03-23 DIAGNOSIS — E669 Obesity, unspecified: Secondary | ICD-10-CM | POA: Diagnosis not present

## 2013-03-23 DIAGNOSIS — E785 Hyperlipidemia, unspecified: Secondary | ICD-10-CM | POA: Diagnosis not present

## 2013-04-30 ENCOUNTER — Encounter (INDEPENDENT_AMBULATORY_CARE_PROVIDER_SITE_OTHER): Payer: Medicare Other | Admitting: Ophthalmology

## 2013-04-30 DIAGNOSIS — E11319 Type 2 diabetes mellitus with unspecified diabetic retinopathy without macular edema: Secondary | ICD-10-CM | POA: Diagnosis not present

## 2013-04-30 DIAGNOSIS — E1139 Type 2 diabetes mellitus with other diabetic ophthalmic complication: Secondary | ICD-10-CM | POA: Diagnosis not present

## 2013-04-30 DIAGNOSIS — H43819 Vitreous degeneration, unspecified eye: Secondary | ICD-10-CM

## 2013-04-30 DIAGNOSIS — H35379 Puckering of macula, unspecified eye: Secondary | ICD-10-CM | POA: Diagnosis not present

## 2013-04-30 DIAGNOSIS — H35039 Hypertensive retinopathy, unspecified eye: Secondary | ICD-10-CM

## 2013-04-30 DIAGNOSIS — I10 Essential (primary) hypertension: Secondary | ICD-10-CM | POA: Diagnosis not present

## 2013-06-15 DIAGNOSIS — E669 Obesity, unspecified: Secondary | ICD-10-CM | POA: Diagnosis not present

## 2013-06-15 DIAGNOSIS — E785 Hyperlipidemia, unspecified: Secondary | ICD-10-CM | POA: Diagnosis not present

## 2013-06-15 DIAGNOSIS — E119 Type 2 diabetes mellitus without complications: Secondary | ICD-10-CM | POA: Diagnosis not present

## 2013-06-15 DIAGNOSIS — E039 Hypothyroidism, unspecified: Secondary | ICD-10-CM | POA: Diagnosis not present

## 2013-06-22 DIAGNOSIS — E785 Hyperlipidemia, unspecified: Secondary | ICD-10-CM | POA: Diagnosis not present

## 2013-06-22 DIAGNOSIS — E018 Other iodine-deficiency related thyroid disorders and allied conditions: Secondary | ICD-10-CM | POA: Diagnosis not present

## 2013-06-22 DIAGNOSIS — E669 Obesity, unspecified: Secondary | ICD-10-CM | POA: Diagnosis not present

## 2013-06-22 DIAGNOSIS — I10 Essential (primary) hypertension: Secondary | ICD-10-CM | POA: Diagnosis not present

## 2013-06-22 DIAGNOSIS — E119 Type 2 diabetes mellitus without complications: Secondary | ICD-10-CM | POA: Diagnosis not present

## 2013-07-16 DIAGNOSIS — J45909 Unspecified asthma, uncomplicated: Secondary | ICD-10-CM | POA: Diagnosis not present

## 2013-07-16 DIAGNOSIS — I10 Essential (primary) hypertension: Secondary | ICD-10-CM | POA: Diagnosis not present

## 2013-07-16 DIAGNOSIS — E119 Type 2 diabetes mellitus without complications: Secondary | ICD-10-CM | POA: Diagnosis not present

## 2013-07-16 DIAGNOSIS — E669 Obesity, unspecified: Secondary | ICD-10-CM | POA: Diagnosis not present

## 2013-10-20 DIAGNOSIS — I10 Essential (primary) hypertension: Secondary | ICD-10-CM | POA: Diagnosis not present

## 2013-10-20 DIAGNOSIS — E785 Hyperlipidemia, unspecified: Secondary | ICD-10-CM | POA: Diagnosis not present

## 2013-10-20 DIAGNOSIS — E119 Type 2 diabetes mellitus without complications: Secondary | ICD-10-CM | POA: Diagnosis not present

## 2013-10-20 DIAGNOSIS — E018 Other iodine-deficiency related thyroid disorders and allied conditions: Secondary | ICD-10-CM | POA: Diagnosis not present

## 2013-10-28 DIAGNOSIS — E018 Other iodine-deficiency related thyroid disorders and allied conditions: Secondary | ICD-10-CM | POA: Diagnosis not present

## 2013-10-28 DIAGNOSIS — E785 Hyperlipidemia, unspecified: Secondary | ICD-10-CM | POA: Diagnosis not present

## 2013-10-28 DIAGNOSIS — I10 Essential (primary) hypertension: Secondary | ICD-10-CM | POA: Diagnosis not present

## 2013-10-28 DIAGNOSIS — E119 Type 2 diabetes mellitus without complications: Secondary | ICD-10-CM | POA: Diagnosis not present

## 2013-10-28 DIAGNOSIS — E669 Obesity, unspecified: Secondary | ICD-10-CM | POA: Diagnosis not present

## 2014-01-21 DIAGNOSIS — IMO0001 Reserved for inherently not codable concepts without codable children: Secondary | ICD-10-CM | POA: Diagnosis not present

## 2014-01-21 DIAGNOSIS — I10 Essential (primary) hypertension: Secondary | ICD-10-CM | POA: Diagnosis not present

## 2014-01-21 DIAGNOSIS — E785 Hyperlipidemia, unspecified: Secondary | ICD-10-CM | POA: Diagnosis not present

## 2014-01-29 DIAGNOSIS — E669 Obesity, unspecified: Secondary | ICD-10-CM | POA: Diagnosis not present

## 2014-01-29 DIAGNOSIS — E119 Type 2 diabetes mellitus without complications: Secondary | ICD-10-CM | POA: Diagnosis not present

## 2014-01-29 DIAGNOSIS — E018 Other iodine-deficiency related thyroid disorders and allied conditions: Secondary | ICD-10-CM | POA: Diagnosis not present

## 2014-01-29 DIAGNOSIS — I10 Essential (primary) hypertension: Secondary | ICD-10-CM | POA: Diagnosis not present

## 2014-01-29 DIAGNOSIS — E785 Hyperlipidemia, unspecified: Secondary | ICD-10-CM | POA: Diagnosis not present

## 2014-02-10 ENCOUNTER — Ambulatory Visit (INDEPENDENT_AMBULATORY_CARE_PROVIDER_SITE_OTHER): Payer: Medicare Other | Admitting: Ophthalmology

## 2014-04-28 DIAGNOSIS — E669 Obesity, unspecified: Secondary | ICD-10-CM | POA: Diagnosis not present

## 2014-04-28 DIAGNOSIS — E018 Other iodine-deficiency related thyroid disorders and allied conditions: Secondary | ICD-10-CM | POA: Diagnosis not present

## 2014-04-28 DIAGNOSIS — E119 Type 2 diabetes mellitus without complications: Secondary | ICD-10-CM | POA: Diagnosis not present

## 2014-04-29 DIAGNOSIS — E119 Type 2 diabetes mellitus without complications: Secondary | ICD-10-CM | POA: Diagnosis not present

## 2014-05-05 DIAGNOSIS — E018 Other iodine-deficiency related thyroid disorders and allied conditions: Secondary | ICD-10-CM | POA: Diagnosis not present

## 2014-05-05 DIAGNOSIS — E785 Hyperlipidemia, unspecified: Secondary | ICD-10-CM | POA: Diagnosis not present

## 2014-05-05 DIAGNOSIS — E119 Type 2 diabetes mellitus without complications: Secondary | ICD-10-CM | POA: Diagnosis not present

## 2014-05-05 DIAGNOSIS — E669 Obesity, unspecified: Secondary | ICD-10-CM | POA: Diagnosis not present

## 2014-05-05 DIAGNOSIS — I10 Essential (primary) hypertension: Secondary | ICD-10-CM | POA: Diagnosis not present

## 2014-08-11 DIAGNOSIS — E038 Other specified hypothyroidism: Secondary | ICD-10-CM | POA: Diagnosis not present

## 2014-08-11 DIAGNOSIS — I10 Essential (primary) hypertension: Secondary | ICD-10-CM | POA: Diagnosis not present

## 2014-08-11 DIAGNOSIS — E119 Type 2 diabetes mellitus without complications: Secondary | ICD-10-CM | POA: Diagnosis not present

## 2014-08-11 DIAGNOSIS — E785 Hyperlipidemia, unspecified: Secondary | ICD-10-CM | POA: Diagnosis not present

## 2014-08-25 DIAGNOSIS — M2041 Other hammer toe(s) (acquired), right foot: Secondary | ICD-10-CM | POA: Diagnosis not present

## 2014-08-25 DIAGNOSIS — M79674 Pain in right toe(s): Secondary | ICD-10-CM | POA: Diagnosis not present

## 2014-08-25 DIAGNOSIS — E6609 Other obesity due to excess calories: Secondary | ICD-10-CM | POA: Diagnosis not present

## 2014-08-25 DIAGNOSIS — E782 Mixed hyperlipidemia: Secondary | ICD-10-CM | POA: Diagnosis not present

## 2014-08-25 DIAGNOSIS — E039 Hypothyroidism, unspecified: Secondary | ICD-10-CM | POA: Diagnosis not present

## 2014-08-25 DIAGNOSIS — E1165 Type 2 diabetes mellitus with hyperglycemia: Secondary | ICD-10-CM | POA: Diagnosis not present

## 2014-08-25 DIAGNOSIS — I10 Essential (primary) hypertension: Secondary | ICD-10-CM | POA: Diagnosis not present

## 2014-09-17 DIAGNOSIS — E039 Hypothyroidism, unspecified: Secondary | ICD-10-CM | POA: Diagnosis not present

## 2014-09-17 DIAGNOSIS — E785 Hyperlipidemia, unspecified: Secondary | ICD-10-CM | POA: Diagnosis not present

## 2014-09-17 DIAGNOSIS — E669 Obesity, unspecified: Secondary | ICD-10-CM | POA: Diagnosis not present

## 2014-09-17 DIAGNOSIS — E119 Type 2 diabetes mellitus without complications: Secondary | ICD-10-CM | POA: Diagnosis not present

## 2014-11-17 DIAGNOSIS — E6609 Other obesity due to excess calories: Secondary | ICD-10-CM | POA: Diagnosis not present

## 2014-11-17 DIAGNOSIS — E782 Mixed hyperlipidemia: Secondary | ICD-10-CM | POA: Diagnosis not present

## 2014-11-17 DIAGNOSIS — E038 Other specified hypothyroidism: Secondary | ICD-10-CM | POA: Diagnosis not present

## 2014-11-17 DIAGNOSIS — Z713 Dietary counseling and surveillance: Secondary | ICD-10-CM | POA: Diagnosis not present

## 2014-11-17 DIAGNOSIS — E1165 Type 2 diabetes mellitus with hyperglycemia: Secondary | ICD-10-CM | POA: Diagnosis not present

## 2014-11-25 DIAGNOSIS — I10 Essential (primary) hypertension: Secondary | ICD-10-CM | POA: Diagnosis not present

## 2014-11-25 DIAGNOSIS — E669 Obesity, unspecified: Secondary | ICD-10-CM | POA: Diagnosis not present

## 2014-11-25 DIAGNOSIS — E032 Hypothyroidism due to medicaments and other exogenous substances: Secondary | ICD-10-CM | POA: Diagnosis not present

## 2014-11-25 DIAGNOSIS — E785 Hyperlipidemia, unspecified: Secondary | ICD-10-CM | POA: Diagnosis not present

## 2014-11-25 DIAGNOSIS — E119 Type 2 diabetes mellitus without complications: Secondary | ICD-10-CM | POA: Diagnosis not present

## 2015-03-17 ENCOUNTER — Other Ambulatory Visit (HOSPITAL_COMMUNITY): Payer: Self-pay | Admitting: Internal Medicine

## 2015-03-17 DIAGNOSIS — H04123 Dry eye syndrome of bilateral lacrimal glands: Secondary | ICD-10-CM | POA: Diagnosis not present

## 2015-03-17 DIAGNOSIS — Z1231 Encounter for screening mammogram for malignant neoplasm of breast: Secondary | ICD-10-CM

## 2015-03-21 ENCOUNTER — Ambulatory Visit (HOSPITAL_COMMUNITY)
Admission: RE | Admit: 2015-03-21 | Discharge: 2015-03-21 | Disposition: A | Discharge status: Home / Self Care | Payer: Medicare Other | Source: Ambulatory Visit | Attending: Internal Medicine | Admitting: Internal Medicine

## 2015-03-21 DIAGNOSIS — I10 Essential (primary) hypertension: Secondary | ICD-10-CM | POA: Diagnosis not present

## 2015-03-21 DIAGNOSIS — E038 Other specified hypothyroidism: Secondary | ICD-10-CM | POA: Diagnosis not present

## 2015-03-21 DIAGNOSIS — Z1231 Encounter for screening mammogram for malignant neoplasm of breast: Secondary | ICD-10-CM | POA: Diagnosis not present

## 2015-03-21 DIAGNOSIS — E119 Type 2 diabetes mellitus without complications: Secondary | ICD-10-CM | POA: Diagnosis not present

## 2015-03-21 DIAGNOSIS — E782 Mixed hyperlipidemia: Secondary | ICD-10-CM | POA: Diagnosis not present

## 2015-03-21 DIAGNOSIS — Z713 Dietary counseling and surveillance: Secondary | ICD-10-CM | POA: Diagnosis not present

## 2015-03-28 DIAGNOSIS — E785 Hyperlipidemia, unspecified: Secondary | ICD-10-CM | POA: Diagnosis not present

## 2015-03-28 DIAGNOSIS — E039 Hypothyroidism, unspecified: Secondary | ICD-10-CM | POA: Diagnosis not present

## 2015-03-28 DIAGNOSIS — I10 Essential (primary) hypertension: Secondary | ICD-10-CM | POA: Diagnosis not present

## 2015-03-28 DIAGNOSIS — E669 Obesity, unspecified: Secondary | ICD-10-CM | POA: Diagnosis not present

## 2015-03-28 DIAGNOSIS — E119 Type 2 diabetes mellitus without complications: Secondary | ICD-10-CM | POA: Diagnosis not present

## 2015-03-28 DIAGNOSIS — E1165 Type 2 diabetes mellitus with hyperglycemia: Secondary | ICD-10-CM | POA: Diagnosis not present

## 2015-07-21 DIAGNOSIS — E119 Type 2 diabetes mellitus without complications: Secondary | ICD-10-CM | POA: Diagnosis not present

## 2015-07-21 DIAGNOSIS — E782 Mixed hyperlipidemia: Secondary | ICD-10-CM | POA: Diagnosis not present

## 2015-07-21 DIAGNOSIS — E038 Other specified hypothyroidism: Secondary | ICD-10-CM | POA: Diagnosis not present

## 2015-07-21 LAB — HEMOGLOBIN A1C: Hgb A1c MFr Bld: 5.8 % (ref 4.0–6.0)

## 2015-07-28 ENCOUNTER — Encounter: Payer: Self-pay | Admitting: "Endocrinology

## 2015-07-28 ENCOUNTER — Ambulatory Visit (INDEPENDENT_AMBULATORY_CARE_PROVIDER_SITE_OTHER): Payer: Medicare Other | Admitting: "Endocrinology

## 2015-07-28 VITALS — BP 124/81 | HR 68 | Ht 63.0 in | Wt 243.0 lb

## 2015-07-28 DIAGNOSIS — I10 Essential (primary) hypertension: Secondary | ICD-10-CM

## 2015-07-28 DIAGNOSIS — J4520 Mild intermittent asthma, uncomplicated: Secondary | ICD-10-CM | POA: Diagnosis not present

## 2015-07-28 DIAGNOSIS — E1165 Type 2 diabetes mellitus with hyperglycemia: Secondary | ICD-10-CM | POA: Diagnosis not present

## 2015-07-28 DIAGNOSIS — E119 Type 2 diabetes mellitus without complications: Secondary | ICD-10-CM | POA: Diagnosis not present

## 2015-07-28 DIAGNOSIS — E785 Hyperlipidemia, unspecified: Secondary | ICD-10-CM | POA: Diagnosis not present

## 2015-07-28 DIAGNOSIS — E890 Postprocedural hypothyroidism: Secondary | ICD-10-CM | POA: Diagnosis not present

## 2015-07-28 DIAGNOSIS — E782 Mixed hyperlipidemia: Secondary | ICD-10-CM | POA: Insufficient documentation

## 2015-07-28 DIAGNOSIS — E032 Hypothyroidism due to medicaments and other exogenous substances: Secondary | ICD-10-CM

## 2015-07-28 MED ORDER — METFORMIN HCL 500 MG PO TABS: 500.0000 mg | ORAL_TABLET | Freq: Two times a day (BID) | ORAL | Status: DC

## 2015-07-28 MED ORDER — LEVOTHYROXINE SODIUM 125 MCG PO TABS: 125.0000 ug | ORAL_TABLET | Freq: Every day | ORAL | Status: DC

## 2015-07-28 NOTE — Progress Notes (Signed)
Subjective:    Patient ID: Cheyenne Benson, female    DOB: 18-Jun-1946, PCP Rosita Fire, MD   Past Medical History  Diagnosis Date  . Asthma   . Dysrhythmia     "used to speed up a little-when my thyroid wa snot right, I'm on Metorpoolol no- no problem"  . Diabetes mellitus without complication ([place])   . Hypothyroidism   . Arthritis    Past Surgical History  Procedure Laterality Date  . Abdominal hysterectomy  1998  . Eye surgery Bilateral 4/20\14    cataract  . Skin graft Left     after axillary surgery  . Axillary surgery Right     removes lump and sweet glands  . Buninectom    . Bunionectomy Bilateral   . Dg styloid process / temporal bones    . Toe surgery      left bones removed from 4 and 5 toe, Right foot 5th toe bone removed  . Cystectomy      behind neck--- no anesthesia  . 25 gauge pars plana vitrectomy with 20 gauge mvr port for macular hole Right 01/22/2013    Procedure: 60 GAUGE PARS PLANA VITRECTOMY WITH 20 GAUGE MVR PORT FOR MACULAR HOLE RIGHT EYE ;  Surgeon: Hayden Pedro, MD;  Location: [place];  Service: Ophthalmology;  Laterality: Right;  . Gas/fluid exchange Right 01/22/2013    Procedure: GAS/FLUID EXCHANGE;  Surgeon: Hayden Pedro, MD;  Location: [place];  Service: Ophthalmology;  Laterality: Right;  C3F8  . Serum patch Right 01/22/2013    Procedure: SERUM PATCH;  Surgeon: Hayden Pedro, MD;  Location: [place];  Service: Ophthalmology;  Laterality: Right;  . Membrane peel Right 01/22/2013    Procedure: MEMBRANE PEEL;  Surgeon: Hayden Pedro, MD;  Location: [place];  Service: Ophthalmology;  Laterality: Right;  . Laser photo ablation Right 01/22/2013    Procedure: LASER PHOTO ABLATION;  Surgeon: Hayden Pedro, MD;  Location: [place];  Service: Ophthalmology;  Laterality: Right;  Headscope   Social History   Social History  . Marital Status: Married    Spouse Name: N/A  . Number of Children: N/A  . Years of Education: N/A   Social History Main  Topics  . Smoking status: Former Smoker -- 2 years  . Smokeless tobacco: None     Comment: maybe smoked a pack a month  . Alcohol Use: No  . Drug Use: No  . Sexual Activity: Not Asked   Other Topics Concern  . None   Social History Narrative   Outpatient Encounter Prescriptions as of 07/28/2015  Medication Sig  . aspirin EC 81 MG tablet Take 81 mg by mouth daily.  Marland Kitchen atorvastatin (LIPITOR) 20 MG tablet Take 20 mg by mouth daily.  . budesonide-formoterol (SYMBICORT) 160-4.5 MCG/ACT inhaler Inhale 2 puffs into the lungs 2 (two) times daily.  Marland Kitchen levothyroxine (SYNTHROID, LEVOTHROID) 125 MCG tablet Take 1 tablet (125 mcg total) by mouth daily.  Marland Kitchen lisinopril-hydrochlorothiazide (PRINZIDE,ZESTORETIC) 10-12.5 MG per tablet Take 1 tablet by mouth daily.  . metFORMIN (GLUCOPHAGE) 500 MG tablet Take 1 tablet (500 mg total) by mouth 2 (two) times daily with a meal.  . metoprolol tartrate (LOPRESSOR) 25 MG tablet Take 25 mg by mouth daily.  . Multiple Vitamin (MULTIVITAMIN WITH MINERALS) TABS Take 1 tablet by mouth daily.  . VENTOLIN HFA 108 (90 BASE) MCG/ACT inhaler Inhale 2 puffs into the lungs every 6 (six) hours as needed. For shortness of  breath  . [DISCONTINUED] levothyroxine (SYNTHROID, LEVOTHROID) 125 MCG tablet Take 125 mcg by mouth daily.  . [DISCONTINUED] metFORMIN (GLUCOPHAGE) 500 MG tablet Take 500 mg by mouth 2 (two) times daily with a meal.  . [DISCONTINUED] Phentermine-Topiramate (QSYMIA) 15-92 MG CP24 Take by mouth daily.  Marland Kitchen ADVAIR DISKUS 250-50 MCG/DOSE AEPB Inhale 1 puff into the lungs daily.  . bacitracin-polymyxin b (POLYSPORIN) ophthalmic ointment Place 1 application into the right eye 4 (four) times daily. apply to eye every 12 hours while awake  . brimonidine (ALPHAGAN) 0.2 % ophthalmic solution Place 1 drop into the right eye 2 (two) times daily.  Marland Kitchen gatifloxacin (ZYMAXID) 0.5 % SOLN Place 1 drop into the right eye 4 (four) times daily.  Marland Kitchen HYDROcodone-acetaminophen  (NORCO/VICODIN) 5-325 MG per tablet Take 1-2 tablets by mouth every 4 (four) hours as needed.  . prednisoLONE acetate (PRED FORTE) 1 % ophthalmic suspension Place 1 drop into the right eye 4 (four) times daily.  . [DISCONTINUED] QSYMIA 7.5-46 MG CP24 Take 1 capsule by mouth daily.   No facility-administered encounter medications on file as of 07/28/2015.   ALLERGIES: Allergies  Allergen Reactions  . Adhesive [Tape]     redness  . Codeine    VACCINATION STATUS:  There is no immunization history on file for this patient.  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 She is here to f/u for f/u of iodine induced hypothyroidism, and type 2 DM. She is currently on Levothyroxine 125 mcg po qam, and MTF 500 mg by mouth twice a day.  After losing 30 pounds, she decided to stop  Qsymia saying "it stopped working ", but then she regained 16 pounds.  She still doesn't want to continue on QSYMIA , and wants to lose weight "her own way" . she reports stress from caring for her elderly sick mother.  She denies any active symptoms. She has family hx of hypothyroidism in her mother. Denies heat/cold intolerance.   Review of Systems   Constitutional: +weight gain, no fatigue, no subjective hyperthermia/hypothermia Eyes: no blurry vision, no xerophthalmia ENT: no sore throat, no nodules palpated in throat, no dysphagia/odynophagia, no hoarseness Cardiovascular: no CP/SOB/palpitations/leg swelling Respiratory: no cough/SOB Gastrointestinal: no N/V/D/C Musculoskeletal: no muscle/joint aches Skin: no rashes Neurological: no tremors/numbness/tingling/dizziness Psychiatric: no depression/anxiety  Objective:    BP 124/81 mmHg  Pulse 68  Ht 5\' 3"  (1.6 m)  Wt 243 lb (110.224 kg)  BMI 43.06 kg/m2  SpO2 96%  Wt Readings from Last 3 Encounters:  07/28/15 243 lb (110.224 kg)  01/22/13 237 lb (107.502 kg)    Physical Exam Constitutional: Obese, in NAD Eyes: PERRLA,  EOMI, no exophthalmos ENT: moist mucous membranes, no thyromegaly, no cervical lymphadenopathy Cardiovascular: RRR, No MRG Respiratory: CTA B Gastrointestinal: abdomen soft, NT, ND, BS+ Musculoskeletal: no deformities, strength intact in all 4 Skin: moist, warm, no rashes Neurological: no tremor with outstretched hands, DTR normal in all 4  Complete Blood Count (Most recent): Lab Results  Component Value Date   WBC 6.9 01/22/2013   HGB 12.3 01/22/2013   HCT 35.5* 01/22/2013   MCV 78.0 01/22/2013   PLT 257 01/22/2013   Chemistry (most recent): Lab Results  Component Value Date   NA 139 01/22/2013   K 3.8 01/22/2013   CL 103 01/22/2013   CO2 28 01/22/2013   BUN 19 01/22/2013   CREATININE 0.70 01/22/2013    Assessment & Plan:   1. Hypothyroidism  due to medicaments and other exogenous substances Pt's recent TFTs show that she is appropriately replaced. Continue LT4 at 125 mcg po qam.  - We discussed about correct intake of levothyroxine, at fasting, with water, separated by at least 30 minutes from breakfast, and separated by more than 4 hours from calcium, iron, multivitamins, acid reflux medications (PPIs). -Patient is made aware of the fact that thyroid hormone replacement is needed for life, dose to be adjusted by periodic monitoring of thyroid function tests.   2. Diabetes mellitus without complication (H[facility]) -123456 is 5.8%. I advised her to continue metformin 500 mg by mouth twice a day. Exercise and carbohydrate management discussed in detail with her. She has been scheduled with Jearld Fenton, CDE for diabetes and nutritional counseling. 3. Essential hypertension, benign -Controlled, continue current medications including lisinopril-hydrochlorthiazide.  4. Hyperlipidemia -Continue Lipitor 20 mg by mouth daily at bedtime.  5. Morbid obesity due to excess calories ([place]) -She doesn't want to continue  Qsymia , even though it was helping. -See #2 above.   - 25 minutes  of time was spent on the care of this patient , 50% of which was applied for counseling on diabetes complications and their preventions.  - I advised patient to maintain close follow up with [facility], MD for primary care needs. Follow up plan: Return in about 6 months (around 01/26/2016) for diabetes, high blood pressure, high cholesterol, underactive thyroid, follow up with pre-visit labs.  Glade Lloyd, MD Phone: [phone]  Fax: [phone]   07/28/2015, 9:11 AM

## 2015-07-28 NOTE — Patient Instructions (Signed)

## 2015-08-30 ENCOUNTER — Other Ambulatory Visit: Payer: Self-pay

## 2015-08-30 MED ORDER — LEVOTHYROXINE SODIUM 125 MCG PO TABS: 125.0000 ug | ORAL_TABLET | Freq: Every day | ORAL | Status: DC

## 2015-09-01 ENCOUNTER — Other Ambulatory Visit: Payer: Self-pay

## 2015-09-01 MED ORDER — LEVOTHYROXINE SODIUM 125 MCG PO TABS: 125.0000 ug | ORAL_TABLET | Freq: Every day | ORAL | Status: DC

## 2015-12-28 DIAGNOSIS — I10 Essential (primary) hypertension: Secondary | ICD-10-CM | POA: Diagnosis not present

## 2015-12-28 DIAGNOSIS — J4521 Mild intermittent asthma with (acute) exacerbation: Secondary | ICD-10-CM | POA: Diagnosis not present

## 2015-12-28 DIAGNOSIS — E1165 Type 2 diabetes mellitus with hyperglycemia: Secondary | ICD-10-CM | POA: Diagnosis not present

## 2016-01-23 ENCOUNTER — Other Ambulatory Visit: Payer: Self-pay | Admitting: "Endocrinology

## 2016-01-23 DIAGNOSIS — E119 Type 2 diabetes mellitus without complications: Secondary | ICD-10-CM | POA: Diagnosis not present

## 2016-01-23 DIAGNOSIS — E032 Hypothyroidism due to medicaments and other exogenous substances: Secondary | ICD-10-CM | POA: Diagnosis not present

## 2016-01-23 LAB — HEMOGLOBIN A1C
HEMOGLOBIN A1C: 5.7 % — AB (ref <5.7)
Mean Plasma Glucose: 117 mg/dL

## 2016-01-23 LAB — TSH: TSH: 0.45 m[IU]/L

## 2016-01-23 LAB — BASIC METABOLIC PANEL
BUN: 22 mg/dL (ref 7–25)
CALCIUM: 9.6 mg/dL (ref 8.6–10.4)
CHLORIDE: 102 mmol/L (ref 98–110)
CO2: 30 mmol/L (ref 20–31)
CREATININE: 0.83 mg/dL (ref 0.50–0.99)
Glucose, Bld: 102 mg/dL — ABNORMAL HIGH (ref 65–99)
Potassium: 4.2 mmol/L (ref 3.5–5.3)
Sodium: 142 mmol/L (ref 135–146)

## 2016-01-23 LAB — T4, FREE: FREE T4: 1.5 ng/dL (ref 0.8–1.8)

## 2016-01-30 ENCOUNTER — Encounter: Payer: Self-pay | Admitting: "Endocrinology

## 2016-01-30 ENCOUNTER — Ambulatory Visit (INDEPENDENT_AMBULATORY_CARE_PROVIDER_SITE_OTHER): Payer: Medicare Other | Admitting: "Endocrinology

## 2016-01-30 VITALS — BP 121/79 | HR 66 | Ht 63.0 in | Wt 247.0 lb

## 2016-01-30 DIAGNOSIS — I10 Essential (primary) hypertension: Secondary | ICD-10-CM

## 2016-01-30 DIAGNOSIS — E890 Postprocedural hypothyroidism: Secondary | ICD-10-CM

## 2016-01-30 DIAGNOSIS — E785 Hyperlipidemia, unspecified: Secondary | ICD-10-CM | POA: Diagnosis not present

## 2016-01-30 DIAGNOSIS — E119 Type 2 diabetes mellitus without complications: Secondary | ICD-10-CM | POA: Diagnosis not present

## 2016-01-30 NOTE — Progress Notes (Signed)
Subjective:    Patient ID: Cheyenne Benson, female    DOB: 09-29-1945, PCP Rosita Fire, MD   Past Medical History  Diagnosis Date  . Asthma   . Dysrhythmia     "used to speed up a little-when my thyroid wa snot right, I'm on Metorpoolol no- no problem"  . Diabetes mellitus without complication ([place])   . Hypothyroidism   . Arthritis    Past Surgical History  Procedure Laterality Date  . Abdominal hysterectomy  1998  . Eye surgery Bilateral 4/20\14    cataract  . Skin graft Left     after axillary surgery  . Axillary surgery Right     removes lump and sweet glands  . Buninectom    . Bunionectomy Bilateral   . Dg styloid process / temporal bones    . Toe surgery      left bones removed from 4 and 5 toe, Right foot 5th toe bone removed  . Cystectomy      behind neck--- no anesthesia  . 25 gauge pars plana vitrectomy with 20 gauge mvr port for macular hole Right 01/22/2013    Procedure: 8 GAUGE PARS PLANA VITRECTOMY WITH 20 GAUGE MVR PORT FOR MACULAR HOLE RIGHT EYE ;  Surgeon: Hayden Pedro, MD;  Location: [place];  Service: Ophthalmology;  Laterality: Right;  . Gas/fluid exchange Right 01/22/2013    Procedure: GAS/FLUID EXCHANGE;  Surgeon: Hayden Pedro, MD;  Location: [place];  Service: Ophthalmology;  Laterality: Right;  C3F8  . Serum patch Right 01/22/2013    Procedure: SERUM PATCH;  Surgeon: Hayden Pedro, MD;  Location: [place];  Service: Ophthalmology;  Laterality: Right;  . Membrane peel Right 01/22/2013    Procedure: MEMBRANE PEEL;  Surgeon: Hayden Pedro, MD;  Location: [place];  Service: Ophthalmology;  Laterality: Right;  . Laser photo ablation Right 01/22/2013    Procedure: LASER PHOTO ABLATION;  Surgeon: Hayden Pedro, MD;  Location: [place];  Service: Ophthalmology;  Laterality: Right;  Headscope   Social History   Social History  . Marital Status: Married    Spouse Name: N/A  . Number of Children: N/A  . Years of Education: N/A   Social History Main  Topics  . Smoking status: Former Smoker -- 2 years  . Smokeless tobacco: None     Comment: maybe smoked a pack a month  . Alcohol Use: No  . Drug Use: No  . Sexual Activity: Not Asked   Other Topics Concern  . None   Social History Narrative   Outpatient Encounter Prescriptions as of 01/30/2016  Medication Sig  . ADVAIR DISKUS 250-50 MCG/DOSE AEPB Inhale 1 puff into the lungs daily.  Marland Kitchen aspirin EC 81 MG tablet Take 81 mg by mouth daily.  Marland Kitchen atorvastatin (LIPITOR) 20 MG tablet Take 20 mg by mouth daily.  . bacitracin-polymyxin b (POLYSPORIN) ophthalmic ointment Place 1 application into the right eye 4 (four) times daily. apply to eye every 12 hours while awake  . brimonidine (ALPHAGAN) 0.2 % ophthalmic solution Place 1 drop into the right eye 2 (two) times daily.  . budesonide-formoterol (SYMBICORT) 160-4.5 MCG/ACT inhaler Inhale 2 puffs into the lungs 2 (two) times daily.  Marland Kitchen gatifloxacin (ZYMAXID) 0.5 % SOLN Place 1 drop into the right eye 4 (four) times daily.  Marland Kitchen HYDROcodone-acetaminophen (NORCO/VICODIN) 5-325 MG per tablet Take 1-2 tablets by mouth every 4 (four) hours as needed.  Marland Kitchen levothyroxine (SYNTHROID, LEVOTHROID) 125 MCG tablet Take  1 tablet (125 mcg total) by mouth daily.  Marland Kitchen lisinopril-hydrochlorothiazide (PRINZIDE,ZESTORETIC) 10-12.5 MG per tablet Take 1 tablet by mouth daily.  . metFORMIN (GLUCOPHAGE) 500 MG tablet Take 1 tablet (500 mg total) by mouth 2 (two) times daily with a meal.  . metoprolol tartrate (LOPRESSOR) 25 MG tablet Take 25 mg by mouth daily.  . Multiple Vitamin (MULTIVITAMIN WITH MINERALS) TABS Take 1 tablet by mouth daily.  . prednisoLONE acetate (PRED FORTE) 1 % ophthalmic suspension Place 1 drop into the right eye 4 (four) times daily.  . VENTOLIN HFA 108 (90 BASE) MCG/ACT inhaler Inhale 2 puffs into the lungs every 6 (six) hours as needed. For shortness of breath   No facility-administered encounter medications on file as of 01/30/2016.    ALLERGIES: Allergies  Allergen Reactions  . Adhesive [Tape]     redness  . Codeine    VACCINATION STATUS:  There is no immunization history on file for this patient.  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 She is here to f/u for f/u of iodine induced hypothyroidism, and type 2 DM. She is currently on Levothyroxine 125 mcg po qam, and MTF 500 mg by mouth twice a day.  After losing 30 pounds, she decided to stop  Qsymia saying "it stopped working ", but then she regained 25 pounds.  She still doesn't want to continue on QSYMIA , and wants to lose weight "her own way" .  She denies any active symptoms. She has family hx of hypothyroidism in her mother. Denies heat/cold intolerance.   Review of Systems   Constitutional: +weight gain, no fatigue, no subjective hyperthermia/hypothermia Eyes: no blurry vision, no xerophthalmia ENT: no sore throat, no nodules palpated in throat, no dysphagia/odynophagia, no hoarseness Cardiovascular: no CP/SOB/palpitations/leg swelling Respiratory: no cough/SOB Gastrointestinal: no N/V/D/C Musculoskeletal: no muscle/joint aches Skin: no rashes Neurological: no tremors/numbness/tingling/dizziness Psychiatric: no depression/anxiety  Objective:    BP 121/79 mmHg  Pulse 66  Ht 5\' 3"  (1.6 m)  Wt 247 lb (112.038 kg)  BMI 43.76 kg/m2  SpO2 97%  Wt Readings from Last 3 Encounters:  01/30/16 247 lb (112.038 kg)  07/28/15 243 lb (110.224 kg)  01/22/13 237 lb (107.502 kg)    Physical Exam Constitutional: Obese, in NAD Eyes: PERRLA, EOMI, no exophthalmos ENT: moist mucous membranes, no thyromegaly, no cervical lymphadenopathy Cardiovascular: RRR, No MRG Respiratory: CTA B Gastrointestinal: abdomen soft, NT, ND, BS+ Musculoskeletal: no deformities, strength intact in all 4 Skin: moist, warm, no rashes Neurological: no tremor with outstretched hands, DTR normal in all 4  Recent Results (from the past 2160  hour(s))  Basic metabolic panel     Status: Abnormal   Collection Time: 01/23/16  8:38 AM  Result Value Ref Range   Sodium 142 135 - 146 mmol/L   Potassium 4.2 3.5 - 5.3 mmol/L   Chloride 102 98 - 110 mmol/L   CO2 30 20 - 31 mmol/L   Glucose, Bld 102 (H) 65 - 99 mg/dL   BUN 22 7 - 25 mg/dL   Creat 0.83 0.50 - 0.99 mg/dL    Comment:   For patients > or = 68 years of age: The upper reference limit for Creatinine is approximately 13% higher for people identified as African-American.      Calcium 9.6 8.6 - 10.4 mg/dL  TSH     Status: None   Collection Time: 01/23/16  8:38 AM  Result Value Ref Range  TSH 0.45 mIU/L    Comment:   Reference Range   > or = 20 Years  0.40-4.50   Pregnancy Range First trimester  0.26-2.66 Second trimester 0.55-2.73 Third trimester  0.43-2.91     T4, free     Status: None   Collection Time: 01/23/16  8:38 AM  Result Value Ref Range   Free T4 1.5 0.8 - 1.8 ng/dL  Hemoglobin A1c     Status: Abnormal   Collection Time: 01/23/16  8:38 AM  Result Value Ref Range   Hgb A1c MFr Bld 5.7 (H) <5.7 %    Comment:   For someone without known diabetes, a hemoglobin A1c value between 5.7% and 6.4% is consistent with prediabetes and should be confirmed with a follow-up test.   For someone with known diabetes, a value <7% indicates that their diabetes is well controlled. A1c targets should be individualized based on duration of diabetes, age, co-morbid conditions and other considerations.   This assay result is consistent with an increased risk of diabetes.   Currently, no consensus exists regarding use of hemoglobin A1c for diagnosis of diabetes in children.      Mean Plasma Glucose 117 mg/dL    Assessment & Plan:   1. Hypothyroidism due to medicaments and other exogenous substances Pt's recent TFTs show that she is appropriately replaced. Continue LT4 at 125 mcg po qam.  - We discussed about correct intake of levothyroxine, at fasting, with  water, separated by at least 30 minutes from breakfast, and separated by more than 4 hours from calcium, iron, multivitamins, acid reflux medications (PPIs). -Patient is made aware of the fact that thyroid hormone replacement is needed for life, dose to be adjusted by periodic monitoring of thyroid function tests.   2. Diabetes mellitus without complication ([facility]) -123456 is 5.7%. I advised her to continue metformin 500 mg by mouth once a day. Exercise and carbohydrate management discussed in detail with her. She has been scheduled with Jearld Fenton, CDE for diabetes and nutritional counseling.  3. Essential hypertension, benign -Controlled, continue current medications including lisinopril-hydrochlorthiazide.  4. Hyperlipidemia -Continue Lipitor 20 mg by mouth daily at bedtime.  5. Morbid obesity due to excess calories ([place]) -She doesn't want to continue  Qsymia , even though it was helping. -See #2 above.   - 25 minutes of time was spent on the care of this patient , 50% of which was applied for counseling on diabetes complications and their preventions.  - I advised patient to maintain close follow up with [facility], MD for primary care needs. Follow up plan: Return in about 6 months (around 07/31/2016) for follow up with pre-visit labs.  Glade Lloyd, MD Phone: [phone]  Fax: [phone]   01/30/2016, 10:03 AM

## 2016-04-23 ENCOUNTER — Encounter: Payer: Self-pay | Admitting: Internal Medicine

## 2016-04-23 DIAGNOSIS — J4520 Mild intermittent asthma, uncomplicated: Secondary | ICD-10-CM | POA: Diagnosis not present

## 2016-04-23 DIAGNOSIS — Z0000 Encounter for general adult medical examination without abnormal findings: Secondary | ICD-10-CM | POA: Diagnosis not present

## 2016-04-23 DIAGNOSIS — E1165 Type 2 diabetes mellitus with hyperglycemia: Secondary | ICD-10-CM | POA: Diagnosis not present

## 2016-04-23 DIAGNOSIS — E039 Hypothyroidism, unspecified: Secondary | ICD-10-CM | POA: Diagnosis not present

## 2016-04-23 DIAGNOSIS — E119 Type 2 diabetes mellitus without complications: Secondary | ICD-10-CM | POA: Diagnosis not present

## 2016-05-01 ENCOUNTER — Other Ambulatory Visit (HOSPITAL_COMMUNITY): Payer: Self-pay | Admitting: Internal Medicine

## 2016-05-01 ENCOUNTER — Telehealth: Payer: Self-pay

## 2016-05-01 DIAGNOSIS — Z1231 Encounter for screening mammogram for malignant neoplasm of breast: Secondary | ICD-10-CM

## 2016-05-01 NOTE — Telephone Encounter (Signed)
Pt received a triage letter. Please call [phone]

## 2016-05-03 ENCOUNTER — Telehealth: Payer: Self-pay

## 2016-05-03 NOTE — Telephone Encounter (Signed)
See separate triage.  

## 2016-05-07 ENCOUNTER — Ambulatory Visit (HOSPITAL_COMMUNITY)
Admission: RE | Admit: 2016-05-07 | Discharge: 2016-05-07 | Disposition: A | Discharge status: Home / Self Care | Payer: Medicare Other | Source: Ambulatory Visit | Attending: Internal Medicine | Admitting: Internal Medicine

## 2016-05-07 DIAGNOSIS — E113293 Type 2 diabetes mellitus with mild nonproliferative diabetic retinopathy without macular edema, bilateral: Secondary | ICD-10-CM | POA: Diagnosis not present

## 2016-05-07 DIAGNOSIS — Z1231 Encounter for screening mammogram for malignant neoplasm of breast: Secondary | ICD-10-CM | POA: Insufficient documentation

## 2016-05-07 DIAGNOSIS — Z7984 Long term (current) use of oral hypoglycemic drugs: Secondary | ICD-10-CM | POA: Diagnosis not present

## 2016-05-07 DIAGNOSIS — H35031 Hypertensive retinopathy, right eye: Secondary | ICD-10-CM | POA: Diagnosis not present

## 2016-05-07 DIAGNOSIS — H04123 Dry eye syndrome of bilateral lacrimal glands: Secondary | ICD-10-CM | POA: Diagnosis not present

## 2016-05-07 DIAGNOSIS — H524 Presbyopia: Secondary | ICD-10-CM | POA: Diagnosis not present

## 2016-05-07 DIAGNOSIS — Z961 Presence of intraocular lens: Secondary | ICD-10-CM | POA: Diagnosis not present

## 2016-05-07 DIAGNOSIS — H43393 Other vitreous opacities, bilateral: Secondary | ICD-10-CM | POA: Diagnosis not present

## 2016-05-07 DIAGNOSIS — H35463 Secondary vitreoretinal degeneration, bilateral: Secondary | ICD-10-CM | POA: Diagnosis not present

## 2016-05-07 DIAGNOSIS — I10 Essential (primary) hypertension: Secondary | ICD-10-CM | POA: Diagnosis not present

## 2016-05-07 DIAGNOSIS — H35371 Puckering of macula, right eye: Secondary | ICD-10-CM | POA: Diagnosis not present

## 2016-05-07 DIAGNOSIS — H26493 Other secondary cataract, bilateral: Secondary | ICD-10-CM | POA: Diagnosis not present

## 2016-05-07 DIAGNOSIS — H40013 Open angle with borderline findings, low risk, bilateral: Secondary | ICD-10-CM | POA: Diagnosis not present

## 2016-05-14 ENCOUNTER — Other Ambulatory Visit: Payer: Self-pay

## 2016-05-14 DIAGNOSIS — Z1211 Encounter for screening for malignant neoplasm of colon: Secondary | ICD-10-CM

## 2016-05-14 NOTE — Telephone Encounter (Signed)
Gastroenterology Pre-Procedure Review  Request Date: 05/14/2016 Requesting Physician: Dr. Legrand Rams  PATIENT REVIEW QUESTIONS: The patient responded to the following health history questions as indicated:     1. Diabetes Melitis: YES 2. Joint replacements in the past 12 months: no 3. Major health problems in the past 3 months: no 4. Has an artificial valve or MVP: no 5. Has a defibrillator: no 6. Has been advised in past to take antibiotics in advance of a procedure like teeth cleaning: no 7. Family history of colon cancer: no  8. Alcohol Use: no 9. History of sleep apnea: no     MEDICATIONS & ALLERGIES:    Patient reports the following regarding taking any blood thinners:   Plavix? no Aspirin? YES Coumadin? no  Patient confirms/reports the following medications:  Current Outpatient Prescriptions  Medication Sig Dispense Refill  . ADVAIR DISKUS 250-50 MCG/DOSE AEPB Inhale 1 puff into the lungs daily.    Marland Kitchen aspirin EC 81 MG tablet Take 81 mg by mouth daily.    Marland Kitchen atorvastatin (LIPITOR) 20 MG tablet Take 20 mg by mouth daily.    Marland Kitchen levothyroxine (SYNTHROID, LEVOTHROID) 125 MCG tablet Take 1 tablet (125 mcg total) by mouth daily. 90 tablet 1  . lisinopril-hydrochlorothiazide (PRINZIDE,ZESTORETIC) 10-12.5 MG per tablet Take 1 tablet by mouth daily.    . metFORMIN (GLUCOPHAGE) 500 MG tablet Take 1 tablet (500 mg total) by mouth 2 (two) times daily with a meal. (Patient taking differently: Take 500 mg by mouth daily. ) 180 tablet 1  . metoprolol tartrate (LOPRESSOR) 25 MG tablet Take 25 mg by mouth daily.    . Multiple Vitamin (MULTIVITAMIN WITH MINERALS) TABS Take 1 tablet by mouth daily.    . NON FORMULARY Pro Air   As directed    . NON FORMULARY Thera Eye Gtts  OTC  As directed    . budesonide-formoterol (SYMBICORT) 160-4.5 MCG/ACT inhaler Inhale 2 puffs into the lungs 2 (two) times daily.     No current facility-administered medications for this visit.     Patient confirms/reports the  following allergies:  Allergies  Allergen Reactions  . Adhesive [Tape]     redness  . Codeine     No orders of the defined types were placed in this encounter.   AUTHORIZATION INFORMATION Primary Insurance:  ID #:   Group #:  Pre-Cert / Auth required:  Pre-Cert / Auth #:   Secondary Insurance:   ID #:   Group #:  Pre-Cert / Auth required:  Pre-Cert / Auth #:   SCHEDULE INFORMATION: Procedure has been scheduled as follows:  Date: 06/06/2016                 Time:  8:30 AM Location: [facility] Short Stay  This Gastroenterology Pre-Precedure Review Form is being routed to the following provider(s): R. Garfield Cornea, MD

## 2016-05-14 NOTE — Telephone Encounter (Signed)
Ok to schedule. DM meds: half dose the evening before (if any in the evening), none the morning of.

## 2016-05-15 MED ORDER — PEG 3350-KCL-NA BICARB-NACL 420 G PO SOLR: 4000.0000 mL | ORAL | 0 refills | Status: DC

## 2016-05-15 NOTE — Telephone Encounter (Signed)
Rx sent to the pharmacy and instructions mailed to pt.  

## 2016-05-31 ENCOUNTER — Telehealth: Payer: Self-pay

## 2016-05-31 NOTE — Telephone Encounter (Signed)
I called [employer] @ [phone] and spoke to Clinton who said that a PA is not required for the screening colonoscopy as out patient. No reference number just her day and today's date.

## 2016-06-06 ENCOUNTER — Ambulatory Visit (HOSPITAL_COMMUNITY)
Admission: RE | Admit: 2016-06-06 | Discharge: 2016-06-06 | Disposition: A | Discharge status: Home / Self Care | Payer: Medicare Other | Source: Ambulatory Visit | Attending: Internal Medicine | Admitting: Internal Medicine

## 2016-06-06 ENCOUNTER — Encounter (HOSPITAL_COMMUNITY): Payer: Self-pay

## 2016-06-06 ENCOUNTER — Encounter (HOSPITAL_COMMUNITY)
Admission: RE | Disposition: A | Discharge status: Home / Self Care | Payer: Self-pay | Source: Ambulatory Visit | Attending: Internal Medicine

## 2016-06-06 DIAGNOSIS — Z1211 Encounter for screening for malignant neoplasm of colon: Secondary | ICD-10-CM | POA: Diagnosis not present

## 2016-06-06 DIAGNOSIS — Z79899 Other long term (current) drug therapy: Secondary | ICD-10-CM | POA: Diagnosis not present

## 2016-06-06 DIAGNOSIS — Z87891 Personal history of nicotine dependence: Secondary | ICD-10-CM | POA: Diagnosis not present

## 2016-06-06 DIAGNOSIS — E119 Type 2 diabetes mellitus without complications: Secondary | ICD-10-CM | POA: Diagnosis not present

## 2016-06-06 DIAGNOSIS — K640 First degree hemorrhoids: Secondary | ICD-10-CM | POA: Insufficient documentation

## 2016-06-06 DIAGNOSIS — Z1212 Encounter for screening for malignant neoplasm of rectum: Secondary | ICD-10-CM

## 2016-06-06 DIAGNOSIS — E039 Hypothyroidism, unspecified: Secondary | ICD-10-CM | POA: Diagnosis not present

## 2016-06-06 DIAGNOSIS — D122 Benign neoplasm of ascending colon: Secondary | ICD-10-CM

## 2016-06-06 DIAGNOSIS — M1990 Unspecified osteoarthritis, unspecified site: Secondary | ICD-10-CM | POA: Diagnosis not present

## 2016-06-06 DIAGNOSIS — J45909 Unspecified asthma, uncomplicated: Secondary | ICD-10-CM | POA: Diagnosis not present

## 2016-06-06 DIAGNOSIS — Z7982 Long term (current) use of aspirin: Secondary | ICD-10-CM | POA: Insufficient documentation

## 2016-06-06 DIAGNOSIS — K5730 Diverticulosis of large intestine without perforation or abscess without bleeding: Secondary | ICD-10-CM | POA: Diagnosis not present

## 2016-06-06 DIAGNOSIS — Z7951 Long term (current) use of inhaled steroids: Secondary | ICD-10-CM | POA: Diagnosis not present

## 2016-06-06 DIAGNOSIS — Z7984 Long term (current) use of oral hypoglycemic drugs: Secondary | ICD-10-CM | POA: Diagnosis not present

## 2016-06-06 HISTORY — PX: COLONOSCOPY: SHX5424

## 2016-06-06 LAB — GLUCOSE, CAPILLARY: GLUCOSE-CAPILLARY: 100 mg/dL — AB (ref 65–99)

## 2016-06-06 SURGERY — COLONOSCOPY
Anesthesia: Moderate Sedation

## 2016-06-06 MED ORDER — MIDAZOLAM HCL 5 MG/5ML IJ SOLN
INTRAMUSCULAR | Status: DC | PRN
  Administered 2016-06-06: 2 mg via INTRAVENOUS
  Administered 2016-06-06 (×2): 1 mg via INTRAVENOUS

## 2016-06-06 MED ORDER — MEPERIDINE HCL 100 MG/ML IJ SOLN
INTRAMUSCULAR | Status: AC
  Filled 2016-06-06: qty 2

## 2016-06-06 MED ORDER — SODIUM CHLORIDE 0.9 % IV SOLN
INTRAVENOUS | Status: DC
  Administered 2016-06-06: 08:00:00 via INTRAVENOUS

## 2016-06-06 MED ORDER — MEPERIDINE HCL 100 MG/ML IJ SOLN
INTRAMUSCULAR | Status: AC
  Filled 2016-06-06: qty 1

## 2016-06-06 MED ORDER — MIDAZOLAM HCL 5 MG/5ML IJ SOLN
INTRAMUSCULAR | Status: AC
  Filled 2016-06-06: qty 10

## 2016-06-06 MED ORDER — MEPERIDINE HCL 100 MG/ML IJ SOLN
INTRAMUSCULAR | Status: DC | PRN
  Administered 2016-06-06: 25 mg via INTRAVENOUS
  Administered 2016-06-06: 50 mg via INTRAVENOUS

## 2016-06-06 MED ORDER — ONDANSETRON HCL 4 MG/2ML IJ SOLN
INTRAMUSCULAR | Status: DC | PRN
  Administered 2016-06-06: 4 mg via INTRAVENOUS

## 2016-06-06 MED ORDER — ONDANSETRON HCL 4 MG/2ML IJ SOLN
INTRAMUSCULAR | Status: AC
  Filled 2016-06-06: qty 2

## 2016-06-06 NOTE — H&P (Signed)
@LOGO @   Primary Care Physician:  Rosita Fire, MD Primary Gastroenterologist:  Dr. Gala Romney  Pre-Procedure History & Physical: HPI:  Cheyenne Benson is a 70 y.o. female is here for a screening colonoscopy. No bowel symptoms. No family history of colon cancer. Negative colonoscopy in the past  Past Medical History:  Diagnosis Date  . Arthritis   . Asthma   . Diabetes mellitus without complication ([place])   . Dysrhythmia    "used to speed up a little-when my thyroid wa snot right, I'm on Metorpoolol no- no problem"  . Hypothyroidism     Past Surgical History:  Procedure Laterality Date  . [place] VITRECTOMY WITH 20 GAUGE MVR PORT FOR MACULAR HOLE Right 01/22/2013   Procedure: 25 GAUGE PARS PLANA VITRECTOMY WITH 20 GAUGE MVR PORT FOR MACULAR HOLE RIGHT EYE ;  Surgeon: Hayden Pedro, MD;  Location: [place];  Service: Ophthalmology;  Laterality: Right;  . ABDOMINAL HYSTERECTOMY  1998  . AXILLARY SURGERY Right    removes lump and sweet glands  . buninectom    . BUNIONECTOMY Bilateral   . CYSTECTOMY     behind neck--- no anesthesia  . DG STYLOID PROCESS / TEMPORAL BONES    . EYE SURGERY Bilateral 4/20\14   cataract  . GAS/FLUID EXCHANGE Right 01/22/2013   Procedure: GAS/FLUID EXCHANGE;  Surgeon: Hayden Pedro, MD;  Location: [place];  Service: Ophthalmology;  Laterality: Right;  C3F8  . LASER PHOTO ABLATION Right 01/22/2013   Procedure: LASER PHOTO ABLATION;  Surgeon: Hayden Pedro, MD;  Location: [place];  Service: Ophthalmology;  Laterality: Right;  Headscope  . MEMBRANE PEEL Right 01/22/2013   Procedure: MEMBRANE PEEL;  Surgeon: Hayden Pedro, MD;  Location: [place];  Service: Ophthalmology;  Laterality: Right;  . SERUM PATCH Right 01/22/2013   Procedure: SERUM PATCH;  Surgeon: Hayden Pedro, MD;  Location: [place];  Service: Ophthalmology;  Laterality: Right;  . SKIN GRAFT Left    after axillary surgery  . TOE SURGERY     left bones removed from 4 and 5 toe, Right foot  5th toe bone removed    Prior to Admission medications   Medication Sig Start Date End Date Taking? Authorizing Provider  ADVAIR DISKUS 250-50 MCG/DOSE AEPB Inhale 1 puff into the lungs daily. 11/11/12  Yes Historical Provider, MD  albuterol (PROVENTIL HFA;VENTOLIN HFA) 108 (90 Base) MCG/ACT inhaler Inhale 1-2 puffs into the lungs every 6 (six) hours as needed for wheezing or shortness of breath.    Yes Historical Provider, MD  aspirin EC 81 MG tablet Take 81 mg by mouth daily.   Yes Historical Provider, MD  atorvastatin (LIPITOR) 20 MG tablet Take 20 mg by mouth daily. 11/11/12  Yes Historical Provider, MD  budesonide-formoterol (SYMBICORT) 160-4.5 MCG/ACT inhaler Inhale 2 puffs into the lungs 2 (two) times daily as needed (shortness of breath). When advair runs out   Yes Historical Provider, MD  Calcium Carbonate Antacid (ANTACID PO) Take 1 tablet by mouth daily as needed (acid reflux).   Yes Historical Provider, MD  Carboxymethylcellulose Sodium (THERATEARS OP) Apply 1-2 drops to eye 2 (two) times daily as needed (dry eyes). 2 drops in the right eye and 1 drop in the left with every use   Yes Historical Provider, MD  Diphenhydramine-PSE-APAP (ALLERGY/SINUS HEADACHE PO) Take 2 tablets by mouth daily as needed (sinus headaches).   Yes Historical Provider, MD  levothyroxine (SYNTHROID, LEVOTHROID) 125 MCG tablet Take 1 tablet (125  mcg total) by mouth daily. 09/01/15  Yes Cassandria Anger, MD  lisinopril-hydrochlorothiazide (PRINZIDE,ZESTORETIC) 10-12.5 MG per tablet Take 1 tablet by mouth daily. 11/11/12  Yes Historical Provider, MD  metFORMIN (GLUCOPHAGE) 500 MG tablet Take 1 tablet (500 mg total) by mouth 2 (two) times daily with a meal. Patient taking differently: Take 500 mg by mouth daily.  07/28/15  Yes Cassandria Anger, MD  metoprolol tartrate (LOPRESSOR) 25 MG tablet Take 25 mg by mouth daily. 11/11/12  Yes Historical Provider, MD  Multiple Vitamin (MULTIVITAMIN WITH MINERALS) TABS Take 1  tablet by mouth daily.   Yes Historical Provider, MD  polyethylene glycol-electrolytes (TRILYTE) 420 g solution Take 4,000 mLs by mouth as directed. 05/15/16  Yes Daneil Dolin, MD    Allergies as of 05/14/2016 - Review Complete 05/14/2016  Allergen Reaction Noted  . Adhesive [tape]  01/22/2013  . Codeine  07/28/2015    History reviewed. No pertinent family history.  Social History   Social History  . Marital status: Married    Spouse name: N/A  . Number of children: N/A  . Years of education: N/A   Occupational History  . Not on file.   Social History Main Topics  . Smoking status: Former Smoker    Years: 2.00  . Smokeless tobacco: Never Used     Comment: maybe smoked a pack a month  . Alcohol use No  . Drug use: No  . Sexual activity: Not on file   Other Topics Concern  . Not on file   Social History Narrative  . No narrative on file    Review of Systems: See HPI, otherwise negative ROS  Physical Exam: BP 137/75   Pulse (!) 59   Temp 97.6 F (36.4 C) (Oral)   Resp 19   Ht 5\' 5"  (1.651 m)   Wt 248 lb (112.5 kg)   SpO2 97%   BMI 41.27 kg/m  General:   Alert,  Well-developed, well-nourished, pleasant and cooperative in NAD Lungs:  Clear throughout to auscultation.   No wheezes, crackles, or rhonchi. No acute distress. Heart:  Regular rate and rhythm; no murmurs, clicks, rubs,  or gallops. Abdomen:  Soft, nontender and nondistended. No masses, hepatosplenomegaly or hernias noted. Normal bowel sounds, without guarding, and without rebound.     Impression/Plan: Cheyenne Benson is now here to undergo a screening colonoscopy.  I reviewed her screening examination    Risks, benefits, limitations, imponderables and alternatives regarding colonoscopy have been reviewed with the patient. Questions have been answered. All parties agreeable.     Notice:  This dictation was prepared with Dragon dictation along with smaller phrase technology. Any transcriptional  errors that result from this process are unintentional and may not be corrected upon review.

## 2016-06-06 NOTE — Op Note (Signed)
[facility] Patient Name: Cheyenne Benson Procedure Date: 06/06/2016 8:26 AM MRN: [id number] Date of Birth: [birth year] Attending MD: Norvel Richards , MD CSN: [id number] Age: 70 Admit Type: Outpatient Procedure:                Colonoscopy with snare polypectomy Indications:              Screening for colorectal malignant neoplasm Providers:                Norvel Richards, MD, Lurline Del, RN, Isabella Stalling, Technician Referring MD:              Medicines:                Midazolam 4 mg IV, Meperidine 75 mg IV Complications:            No immediate complications. Estimated Blood Loss:     Estimated blood loss was minimal. Procedure:                Pre-Anesthesia Assessment:                           - Prior to the procedure, a History and Physical                            was performed, and patient medications and                            allergies were reviewed. The patient's tolerance of                            previous anesthesia was also reviewed. The risks                            and benefits of the procedure and the sedation                            options and risks were discussed with the patient.                            All questions were answered, and informed consent                            was obtained. Prior Anticoagulants: The patient has                            taken no previous anticoagulant or antiplatelet                            agents. ASA Grade Assessment: II - A patient with                            mild systemic disease. After reviewing the risks  and benefits, the patient was deemed in                            satisfactory condition to undergo the procedure.                           After obtaining informed consent, the colonoscope                            was passed under direct vision. Throughout the                            procedure, the patient's blood pressure,  pulse, and                            oxygen saturations were monitored continuously. The                            [id number] scope was introduced through                            the anus and advanced to the the cecum, identified                            by appendiceal orifice and ileocecal valve. The                            colonoscopy was performed without difficulty. The                            patient tolerated the procedure well. The quality                            of the bowel preparation was adequate. The                            ileocecal valve, appendiceal orifice, and rectum                            were photographed. The entire colon was well                            visualized. Scope In: 8:46:55 AM Scope Out: 9:02:49 AM Scope Withdrawal Time: 0 hours 9 minutes 36 seconds  Total Procedure Duration: 0 hours 15 minutes 54 seconds  Findings:      The perianal and digital rectal examinations were normal.      A 5 mm polyp was found in the ascending colon. The polyp was       semi-pedunculated. The polyp was removed with a cold snare. Resection       and retrieval were complete. Estimated blood loss was minimal.      Scattered small and large-mouthed diverticula were found in the entire       colon.      Internal hemorrhoids were found during retroflexion. The hemorrhoids  were Grade I (internal hemorrhoids that do not prolapse).      The exam was otherwise without abnormality on direct and retroflexion       views. Impression:               - One 5 mm polyp in the ascending colon, removed                            with a cold snare. Resected and retrieved.                           - Diverticulosis in the entire examined colon.                           - Internal hemorrhoids.                           - The examination was otherwise normal on direct                            and retroflexion views. Moderate Sedation:      Moderate  (conscious) sedation was administered by the endoscopy nurse       and supervised by the endoscopist. The following parameters were       monitored: oxygen saturation, heart rate, blood pressure, respiratory       rate, EKG, adequacy of pulmonary ventilation, and response to care.       Total physician intraservice time was 23 minutes. Recommendation:           - Discharge patient to home.                           - Resume previous diet.                           - Continue present medications.                           - Repeat colonoscopy date to be determined after                            pending pathology results are reviewed for                            surveillance based on pathology results.                           - Return to GI office (date not yet determined). Procedure Code(s):        --- Professional ---                           [phone], Colonoscopy, flexible; with removal of                            tumor(s), polyp(s), or other lesion(s) by snare  technique                           [phone], Moderate sedation services provided by the                            same physician or other qualified health care                            professional performing the diagnostic or                            therapeutic service that the sedation supports,                            requiring the presence of an independent trained                            observer to assist in the monitoring of the                            patient's level of consciousness and physiological                            status; initial 15 minutes of intraservice time,                            patient age 13 years or older                           [phone], Moderate sedation services; each additional                            15 minutes intraservice time Diagnosis Code(s):        --- Professional ---                           Z12.11, Encounter for screening for malignant                             neoplasm of colon                           D12.2, Benign neoplasm of ascending colon                           K64.0, First degree hemorrhoids                           K57.30, Diverticulosis of large intestine without                            perforation or abscess without bleeding CPT copyright 2016 American Medical Association. All rights reserved. The codes documented in this report are preliminary and upon coder review may  be revised to meet current compliance requirements. Cristopher Estimable. Gala Romney, MD  Norvel Richards, MD 06/06/2016 9:12:20 AM This report has been signed electronically. Number of Addenda: 0

## 2016-06-06 NOTE — Discharge Instructions (Addendum)
°Colonoscopy °Discharge Instructions ° °Read the instructions outlined below and refer to this sheet in the next few weeks. These discharge instructions provide you with general information on caring for yourself after you leave the hospital. Your doctor may also give you specific instructions. While your treatment has been planned according to the most current medical practices available, unavoidable complications occasionally occur. If you have any problems or questions after discharge, call Dr. Rourk at [phone]. °ACTIVITY °· You may resume your regular activity, but move at a slower pace for the next 24 hours.  °· Take frequent rest periods for the next 24 hours.  °· Walking will help get rid of the air and reduce the bloated feeling in your belly (abdomen).  °· No driving for 24 hours (because of the medicine (anesthesia) used during the test).   °· Do not sign any important legal documents or operate any machinery for 24 hours (because of the anesthesia used during the test).  °NUTRITION °· Drink plenty of fluids.  °· You may resume your normal diet as instructed by your doctor.  °· Begin with a light meal and progress to your normal diet. Heavy or fried foods are harder to digest and may make you feel sick to your stomach (nauseated).  °· Avoid alcoholic beverages for 24 hours or as instructed.  °MEDICATIONS °· You may resume your normal medications unless your doctor tells you otherwise.  °WHAT YOU CAN EXPECT TODAY °· Some feelings of bloating in the abdomen.  °· Passage of more gas than usual.  °· Spotting of blood in your stool or on the toilet paper.  °IF YOU HAD POLYPS REMOVED DURING THE COLONOSCOPY: °· No aspirin products for 7 days or as instructed.  °· No alcohol for 7 days or as instructed.  °· Eat a soft diet for the next 24 hours.  °FINDING OUT THE RESULTS OF YOUR TEST °Not all test results are available during your visit. If your test results are not back during the visit, make an appointment  with your caregiver to find out the results. Do not assume everything is normal if you have not heard from your caregiver or the medical facility. It is important for you to follow up on all of your test results.  °SEEK IMMEDIATE MEDICAL ATTENTION IF: °· You have more than a spotting of blood in your stool.  °· Your belly is swollen (abdominal distention).  °· You are nauseated or vomiting.  °· You have a temperature over 101.  °· You have abdominal pain or discomfort that is severe or gets worse throughout the day.  ° ° ° °Colon polyp and diverticulosis information provided ° °Further recommendations to follow pending review of pathology report ° ° ° ° ° °                                                                                                                     Colon Polyps °Polyps are lumps of extra tissue growing inside the   body. Polyps can grow in the large intestine (colon). Most colon polyps are noncancerous (benign). However, some colon polyps can become cancerous over time. Polyps that are larger than a pea may be harmful. To be safe, caregivers remove and test all polyps. °CAUSES  °Polyps form when mutations in the genes cause your cells to grow and divide even though no more tissue is needed. °RISK FACTORS °There are a number of risk factors that can increase your chances of getting colon polyps. They include: °· Being older than 50 years. °· Family history of colon polyps or colon cancer. °· Long-term colon diseases, such as colitis or Crohn disease. °· Being overweight. °· Smoking. °· Being inactive. °· Drinking too much alcohol. °SYMPTOMS  °Most small polyps do not cause symptoms. If symptoms are present, they may include: °· Blood in the stool. The stool may look dark red or black. °· Constipation or diarrhea that lasts longer than 1 week. °DIAGNOSIS °People often do not know they have polyps until their caregiver finds them during a regular checkup. Your caregiver can use 4 tests to check for  polyps: °· Digital rectal exam. The caregiver wears gloves and feels inside the rectum. This test would find polyps only in the rectum. °· Barium enema. The caregiver puts a liquid called barium into your rectum before taking X-rays of your colon. Barium makes your colon look white. Polyps are dark, so they are easy to see in the X-ray pictures. °· Sigmoidoscopy. A thin, flexible tube (sigmoidoscope) is placed into your rectum. The sigmoidoscope has a light and tiny camera in it. The caregiver uses the sigmoidoscope to look at the last third of your colon. °· Colonoscopy. This test is like sigmoidoscopy, but the caregiver looks at the entire colon. This is the most common method for finding and removing polyps. °TREATMENT  °Any polyps will be removed during a sigmoidoscopy or colonoscopy. The polyps are then tested for cancer. °PREVENTION  °To help lower your risk of getting more colon polyps: °· Eat plenty of fruits and vegetables. Avoid eating fatty foods. °· Do not smoke. °· Avoid drinking alcohol. °· Exercise every day. °· Lose weight if recommended by your caregiver. °· Eat plenty of calcium and folate. Foods that are rich in calcium include milk, cheese, and broccoli. Foods that are rich in folate include chickpeas, kidney beans, and spinach. °HOME CARE INSTRUCTIONS °Keep all follow-up appointments as directed by your caregiver. You may need periodic exams to check for polyps. °SEEK MEDICAL CARE IF: °You notice bleeding during a bowel movement. °  °This information is not intended to replace advice given to you by your health care provider. Make sure you discuss any questions you have with your health care provider. °  °Document Released: 04/25/2004 Document Revised: 08/20/2014 Document Reviewed: 10/09/2011 °Elsevier Interactive Patient Education ©2016 Elsevier Inc. ° ° ° ° ° ° ° °Diverticulosis °Diverticulosis is the condition that develops when small pouches (diverticula) form in the wall of your colon. Your  colon, or large intestine, is where water is absorbed and stool is formed. The pouches form when the inside layer of your colon pushes through weak spots in the outer layers of your colon. °CAUSES  °No one knows exactly what causes diverticulosis. °RISK FACTORS °· Being older than 50. Your risk for this condition increases with age. Diverticulosis is rare in people younger than 40 years. By age 80, almost everyone has it. °· Eating a low-fiber diet. °· Being frequently constipated. °· Being overweight. °·   INSTRUCTIONS   Drink at least 6-8 glasses of water each day to prevent constipation.  Try not to strain when you have a bowel movement.  Keep all follow-up appointments. If you have had an infection before:  Increase the fiber in your diet as directed by your health care provider or dietitian.  Take a dietary fiber supplement if your health care provider approves.  Only take medicines as directed by your health care provider. SEEK MEDICAL CARE IF:   You have abdominal pain.  You have bloating.  You have cramps.  You have not gone to the bathroom in 3 days. SEEK IMMEDIATE MEDICAL CARE IF:   Your pain gets worse.  Yourbloating becomes very bad.  You have a fever or chills, and your symptoms suddenly get  worse.  You begin vomiting.  You have bowel movements that are bloody or black. MAKE SURE YOU:  Understand these instructions.  Will watch your condition.  Will get help right away if you are not doing well or get worse.   This information is not intended to replace advice given to you by your health care provider. Make sure you discuss any questions you have with your health care provider.   Document Released: 04/26/2004 Document Revised: 08/04/2013 Document Reviewed: 06/24/2013 Elsevier Interactive Patient Education [employer].

## 2016-06-08 ENCOUNTER — Encounter: Payer: Self-pay | Admitting: Internal Medicine

## 2016-06-08 ENCOUNTER — Other Ambulatory Visit: Payer: Self-pay | Admitting: "Endocrinology

## 2016-06-11 ENCOUNTER — Encounter (HOSPITAL_COMMUNITY): Payer: Self-pay | Admitting: Internal Medicine

## 2016-07-26 ENCOUNTER — Encounter: Payer: Self-pay | Admitting: "Endocrinology

## 2016-07-26 ENCOUNTER — Ambulatory Visit (INDEPENDENT_AMBULATORY_CARE_PROVIDER_SITE_OTHER): Payer: Medicare Other | Admitting: "Endocrinology

## 2016-07-26 VITALS — BP 132/84 | HR 67 | Ht 63.0 in | Wt 258.0 lb

## 2016-07-26 DIAGNOSIS — E782 Mixed hyperlipidemia: Secondary | ICD-10-CM

## 2016-07-26 DIAGNOSIS — E890 Postprocedural hypothyroidism: Secondary | ICD-10-CM

## 2016-07-26 DIAGNOSIS — E119 Type 2 diabetes mellitus without complications: Secondary | ICD-10-CM

## 2016-07-26 DIAGNOSIS — I10 Essential (primary) hypertension: Secondary | ICD-10-CM | POA: Diagnosis not present

## 2016-07-26 NOTE — Progress Notes (Signed)
Subjective:    Patient ID: Cheyenne Benson, female    DOB: 11/04/1945, PCP Rosita Fire, MD   Past Medical History:  Diagnosis Date  . Arthritis   . Asthma   . Diabetes mellitus without complication ([place])   . Dysrhythmia    "used to speed up a little-when my thyroid wa snot right, I'm on Metorpoolol no- no problem"  . Hypothyroidism    Past Surgical History:  Procedure Laterality Date  . [place] VITRECTOMY WITH 20 GAUGE MVR PORT FOR MACULAR HOLE Right 01/22/2013   Procedure: 25 GAUGE PARS PLANA VITRECTOMY WITH 20 GAUGE MVR PORT FOR MACULAR HOLE RIGHT EYE ;  Surgeon: Hayden Pedro, MD;  Location: [place];  Service: Ophthalmology;  Laterality: Right;  . ABDOMINAL HYSTERECTOMY  1998  . AXILLARY SURGERY Right    removes lump and sweet glands  . buninectom    . BUNIONECTOMY Bilateral   . COLONOSCOPY N/A 06/06/2016   Procedure: COLONOSCOPY;  Surgeon: Daneil Dolin, MD;  [facility];  Service: Endoscopy;  Laterality: N/A;  8:30 AM  . CYSTECTOMY     behind neck--- no anesthesia  . DG STYLOID PROCESS / TEMPORAL BONES    . EYE SURGERY Bilateral 4/20\14   cataract  . GAS/FLUID EXCHANGE Right 01/22/2013   Procedure: GAS/FLUID EXCHANGE;  Surgeon: Hayden Pedro, MD;  Location: [place];  Service: Ophthalmology;  Laterality: Right;  C3F8  . LASER PHOTO ABLATION Right 01/22/2013   Procedure: LASER PHOTO ABLATION;  Surgeon: Hayden Pedro, MD;  Location: [place];  Service: Ophthalmology;  Laterality: Right;  Headscope  . MEMBRANE PEEL Right 01/22/2013   Procedure: MEMBRANE PEEL;  Surgeon: Hayden Pedro, MD;  Location: [place];  Service: Ophthalmology;  Laterality: Right;  . SERUM PATCH Right 01/22/2013   Procedure: SERUM PATCH;  Surgeon: Hayden Pedro, MD;  Location: [place];  Service: Ophthalmology;  Laterality: Right;  . SKIN GRAFT Left    after axillary surgery  . TOE SURGERY     left bones removed from 4 and 5 toe, Right foot 5th toe bone removed   Social History    Social History  . Marital status: Married    Spouse name: N/A  . Number of children: N/A  . Years of education: N/A   Social History Main Topics  . Smoking status: Former Smoker    Years: 2.00  . Smokeless tobacco: Never Used     Comment: maybe smoked a pack a month  . Alcohol use No  . Drug use: No  . Sexual activity: Not Asked   Other Topics Concern  . None   Social History Narrative  . None   Outpatient Encounter Prescriptions as of 07/26/2016  Medication Sig  . ADVAIR DISKUS 250-50 MCG/DOSE AEPB Inhale 1 puff into the lungs daily.  Marland Kitchen albuterol (PROVENTIL HFA;VENTOLIN HFA) 108 (90 Base) MCG/ACT inhaler Inhale 1-2 puffs into the lungs every 6 (six) hours as needed for wheezing or shortness of breath.   Marland Kitchen aspirin EC 81 MG tablet Take 81 mg by mouth daily.  Marland Kitchen atorvastatin (LIPITOR) 20 MG tablet Take 20 mg by mouth daily.  . budesonide-formoterol (SYMBICORT) 160-4.5 MCG/ACT inhaler Inhale 2 puffs into the lungs 2 (two) times daily as needed (shortness of breath). When advair runs out  . Calcium Carbonate Antacid (ANTACID PO) Take 1 tablet by mouth daily as needed (acid reflux).  . Carboxymethylcellulose Sodium (THERATEARS OP) Apply 1-2 drops to eye  2 (two) times daily as needed (dry eyes). 2 drops in the right eye and 1 drop in the left with every use  . Diphenhydramine-PSE-APAP (ALLERGY/SINUS HEADACHE PO) Take 2 tablets by mouth daily as needed (sinus headaches).  Marland Kitchen levothyroxine (SYNTHROID, LEVOTHROID) 125 MCG tablet Take 1 tablet (125 mcg total) by mouth daily.  Marland Kitchen lisinopril-hydrochlorothiazide (PRINZIDE,ZESTORETIC) 10-12.5 MG per tablet Take 1 tablet by mouth daily.  . metFORMIN (GLUCOPHAGE) 500 MG tablet Take 1 tablet (500 mg total) by mouth 2 (two) times daily with a meal. (Patient taking differently: Take 500 mg by mouth daily. )  . metoprolol tartrate (LOPRESSOR) 25 MG tablet Take 25 mg by mouth daily.  . Multiple Vitamin (MULTIVITAMIN WITH MINERALS) TABS Take 1 tablet  by mouth daily.  . polyethylene glycol-electrolytes (TRILYTE) 420 g solution Take 4,000 mLs by mouth as directed.  . [DISCONTINUED] levothyroxine (SYNTHROID, LEVOTHROID) 125 MCG tablet TAKE ONE TABLET BY MOUTH ONCE DAILY   No facility-administered encounter medications on file as of 07/26/2016.    ALLERGIES: Allergies  Allergen Reactions  . Adhesive [Tape]     redness  . Codeine Nausea Only    Upset stomach    VACCINATION STATUS:  There is no immunization history on file for this patient.  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 She is here to f/u for f/u of iodine induced hypothyroidism, and type 2 DM. She is currently on Levothyroxine 125 mcg po qam, and MTF 500 mg by mouth twice a day.  After losing 30 pounds, she decided to stop  Qsymia saying "it stopped working ". - She is regaining those pounds back.  She still doesn't want to continue on QSYMIA , and wants to lose weight "her own way" .  She denies any active symptoms. She has family hx of hypothyroidism in her mother. Denies heat/cold intolerance.   Review of Systems   Constitutional: +weight gain, no fatigue, no subjective hyperthermia/hypothermia Eyes: no blurry vision, no xerophthalmia ENT: no sore throat, no nodules palpated in throat, no dysphagia/odynophagia, no hoarseness Cardiovascular: no CP/SOB/palpitations/leg swelling Respiratory: no cough/SOB Gastrointestinal: no N/V/D/C Musculoskeletal: no muscle/joint aches Skin: no rashes Neurological: no tremors/numbness/tingling/dizziness Psychiatric: no depression/anxiety  Objective:    BP 132/84   Pulse 67   Ht 5\' 3"  (1.6 m)   Wt 258 lb (117 kg)   BMI 45.70 kg/m   Wt Readings from Last 3 Encounters:  07/26/16 258 lb (117 kg)  06/06/16 248 lb (112.5 kg)  01/30/16 247 lb (112 kg)    Physical Exam Constitutional: Obese, in NAD Eyes: PERRLA, EOMI, no exophthalmos ENT: moist mucous membranes, no thyromegaly, no cervical  lymphadenopathy Cardiovascular: RRR, No MRG Respiratory: CTA B Gastrointestinal: abdomen soft, NT, ND, BS+ Musculoskeletal: no deformities, strength intact in all 4 Skin: moist, warm, no rashes Neurological: no tremor with outstretched hands, DTR normal in all 4  Recent Results (from the past 2160 hour(s))  Glucose, capillary     Status: Abnormal   Collection Time: 06/06/16  8:14 AM  Result Value Ref Range   Glucose-Capillary 100 (H) 65 - 99 mg/dL    Assessment & Plan:   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 Continue LT4 at 125 mcg po qam.  - We discussed about correct intake of levothyroxine, at fasting, with water, separated by at least 30 minutes from breakfast, and separated by more than 4 hours from  calcium, iron, multivitamins, acid reflux medications (PPIs). -Patient is made aware of the fact that thyroid hormone replacement is needed for life, dose to be adjusted by periodic monitoring of thyroid function tests.   2. Diabetes mellitus without complication (H[facility]) -123456 is 5.6%. I advised her to continue metformin 500 mg by mouth once a day. Exercise and carbohydrate management discussed in detail with her. She has been scheduled with Jearld Fenton, CDE for diabetes and nutritional counseling.  3. Essential hypertension, benign -Controlled, continue current medications including lisinopril-hydrochlorthiazide.  4. Hyperlipidemia -Continue Lipitor 20 mg by mouth daily at bedtime.  5. Morbid obesity due to excess calories [facility]): - She is regaining her weight back, gaining 10 pounds since last visit. During her last visit she didn't want to continue continue  Qsymia , even though it was helping. Now she sees her insurance would not pay for it. -See #2 above.   - 25 minutes of time was spent on the care of this patient , 50% of which was applied for counseling on diabetes complications and their preventions.  - I advised  patient to maintain close follow up with [facility], MD for primary care needs. Follow up plan: Return in about 3 months (around 10/24/2016) for follow up with pre-visit labs.  Glade Lloyd, MD Phone: [phone]  Fax: [phone]   07/26/2016, 9:33 AM

## 2016-10-19 ENCOUNTER — Other Ambulatory Visit: Payer: Self-pay | Admitting: "Endocrinology

## 2016-10-19 LAB — T4, FREE: Free T4: 1.4 ng/dL (ref 0.8–1.8)

## 2016-10-19 LAB — TSH: TSH: 0.17 m[IU]/L — AB

## 2016-10-26 ENCOUNTER — Encounter: Payer: Self-pay | Admitting: "Endocrinology

## 2016-10-26 ENCOUNTER — Ambulatory Visit (INDEPENDENT_AMBULATORY_CARE_PROVIDER_SITE_OTHER): Payer: Medicare Other | Admitting: "Endocrinology

## 2016-10-26 VITALS — BP 136/82 | HR 62 | Ht 63.0 in | Wt 253.0 lb

## 2016-10-26 DIAGNOSIS — I10 Essential (primary) hypertension: Secondary | ICD-10-CM | POA: Diagnosis not present

## 2016-10-26 DIAGNOSIS — E782 Mixed hyperlipidemia: Secondary | ICD-10-CM

## 2016-10-26 DIAGNOSIS — E119 Type 2 diabetes mellitus without complications: Secondary | ICD-10-CM | POA: Diagnosis not present

## 2016-10-26 DIAGNOSIS — E890 Postprocedural hypothyroidism: Secondary | ICD-10-CM | POA: Diagnosis not present

## 2016-10-26 NOTE — Progress Notes (Signed)
Subjective:    Patient ID: Cheyenne Benson, female    DOB: [birth year], PCP Rosita Fire, MD   Past Medical History:  Diagnosis Date  . Arthritis   . Asthma   . Diabetes mellitus without complication ([place])   . Dysrhythmia    "used to speed up a little-when my thyroid wa snot right, I'm on Metorpoolol no- no problem"  . Hypothyroidism    Past Surgical History:  Procedure Laterality Date  . [place] VITRECTOMY WITH 20 GAUGE MVR PORT FOR MACULAR HOLE Right 01/22/2013   Procedure: 25 GAUGE PARS PLANA VITRECTOMY WITH 20 GAUGE MVR PORT FOR MACULAR HOLE RIGHT EYE ;  Surgeon: Hayden Pedro, MD;  Location: [place];  Service: Ophthalmology;  Laterality: Right;  . ABDOMINAL HYSTERECTOMY  1998  . AXILLARY SURGERY Right    removes lump and sweet glands  . buninectom    . BUNIONECTOMY Bilateral   . COLONOSCOPY N/A 06/06/2016   Procedure: COLONOSCOPY;  Surgeon: Daneil Dolin, MD;  [facility];  Service: Endoscopy;  Laterality: N/A;  8:30 AM  . CYSTECTOMY     behind neck--- no anesthesia  . DG STYLOID PROCESS / TEMPORAL BONES    . EYE SURGERY Bilateral 4/20\14   cataract  . GAS/FLUID EXCHANGE Right 01/22/2013   Procedure: GAS/FLUID EXCHANGE;  Surgeon: Hayden Pedro, MD;  Location: [place];  Service: Ophthalmology;  Laterality: Right;  C3F8  . LASER PHOTO ABLATION Right 01/22/2013   Procedure: LASER PHOTO ABLATION;  Surgeon: Hayden Pedro, MD;  Location: [place];  Service: Ophthalmology;  Laterality: Right;  Headscope  . MEMBRANE PEEL Right 01/22/2013   Procedure: MEMBRANE PEEL;  Surgeon: Hayden Pedro, MD;  Location: [place];  Service: Ophthalmology;  Laterality: Right;  . SERUM PATCH Right 01/22/2013   Procedure: SERUM PATCH;  Surgeon: Hayden Pedro, MD;  Location: [place];  Service: Ophthalmology;  Laterality: Right;  . SKIN GRAFT Left    after axillary surgery  . TOE SURGERY     left bones removed from 4 and 5 toe, Right foot 5th toe bone removed   Social History    Social History  . Marital status: Married    Spouse name: N/A  . Number of children: N/A  . Years of education: N/A   Social History Main Topics  . Smoking status: Former Smoker    Years: 2.00  . Smokeless tobacco: Never Used     Comment: maybe smoked a pack a month  . Alcohol use No  . Drug use: No  . Sexual activity: Not Asked   Other Topics Concern  . None   Social History Narrative  . None   Outpatient Encounter Prescriptions as of 10/26/2016  Medication Sig  . ADVAIR DISKUS 250-50 MCG/DOSE AEPB Inhale 1 puff into the lungs daily.  Marland Kitchen albuterol (PROVENTIL HFA;VENTOLIN HFA) 108 (90 Base) MCG/ACT inhaler Inhale 1-2 puffs into the lungs every 6 (six) hours as needed for wheezing or shortness of breath.   Marland Kitchen aspirin EC 81 MG tablet Take 81 mg by mouth daily.  Marland Kitchen atorvastatin (LIPITOR) 20 MG tablet Take 20 mg by mouth daily.  . budesonide-formoterol (SYMBICORT) 160-4.5 MCG/ACT inhaler Inhale 2 puffs into the lungs 2 (two) times daily as needed (shortness of breath). When advair runs out  . Calcium Carbonate Antacid (ANTACID PO) Take 1 tablet by mouth daily as needed (acid reflux).  . Carboxymethylcellulose Sodium (THERATEARS OP) Apply 1-2 drops to eye  2 (two) times daily as needed (dry eyes). 2 drops in the right eye and 1 drop in the left with every use  . Diphenhydramine-PSE-APAP (ALLERGY/SINUS HEADACHE PO) Take 2 tablets by mouth daily as needed (sinus headaches).  Marland Kitchen levothyroxine (SYNTHROID, LEVOTHROID) 125 MCG tablet Take 1 tablet (125 mcg total) by mouth daily.  Marland Kitchen lisinopril-hydrochlorothiazide (PRINZIDE,ZESTORETIC) 10-12.5 MG per tablet Take 1 tablet by mouth daily.  . metFORMIN (GLUCOPHAGE) 500 MG tablet Take 1 tablet (500 mg total) by mouth 2 (two) times daily with a meal. (Patient taking differently: Take 500 mg by mouth daily. )  . metoprolol tartrate (LOPRESSOR) 25 MG tablet Take 25 mg by mouth daily.  . Multiple Vitamin (MULTIVITAMIN WITH MINERALS) TABS Take 1 tablet by  mouth daily.  . polyethylene glycol-electrolytes (TRILYTE) 420 g solution Take 4,000 mLs by mouth as directed.   No facility-administered encounter medications on file as of 10/26/2016.    ALLERGIES: Allergies  Allergen Reactions  . Adhesive [Tape]     redness  . Codeine Nausea Only    Upset stomach    VACCINATION STATUS:  There is no immunization history on file for this patient.  HPI  71 yr old female with medical hx of GD s/p RAI therapy Approximately at age of 21 years .  She is here to f/u for f/u of iodine induced hypothyroidism, and type 2 DM. She is currently on Levothyroxine 125 mcg po qam, and MTF 500 mg by mouth once a day.  - She has been overweight/obese most of her adult life. - She was treated briefly with ischemia which was helping her lose weight however she didn't continue with it for unclear reasons. She still doesn't want to continue on QSYMIA , and wants to lose weight " on her own way" .  She denies any active symptoms. She has family hx of hypothyroidism in her mother. Denies heat/cold intolerance.   Review of Systems   Constitutional: + Lost 5 pounds since last visit, no fatigue, no subjective hyperthermia/hypothermia Eyes: no blurry vision, no xerophthalmia ENT: no sore throat, no nodules palpated in throat, no dysphagia/odynophagia, no hoarseness Cardiovascular: no CP/SOB/palpitations/leg swelling Respiratory: no cough/SOB Gastrointestinal: no N/V/D/C Musculoskeletal: no muscle/joint aches Skin: no rashes Neurological: no tremors/numbness/tingling/dizziness Psychiatric: no depression/anxiety  Objective:    BP 136/82   Pulse 62   Ht 5\' 3"  (1.6 m)   Wt 253 lb (114.8 kg)   BMI 44.82 kg/m   Wt Readings from Last 3 Encounters:  10/26/16 253 lb (114.8 kg)  07/26/16 258 lb (117 kg)  06/06/16 248 lb (112.5 kg)    Physical Exam Constitutional: Obese, in NAD Eyes: PERRLA, EOMI, no exophthalmos ENT: moist mucous membranes, no thyromegaly, no  cervical lymphadenopathy Cardiovascular: RRR, No MRG Respiratory: CTA B Gastrointestinal: abdomen soft, NT, ND, BS+ Musculoskeletal: no deformities, strength intact in all 4 Skin: moist, warm, no rashes Neurological: no tremor with outstretched hands, DTR normal in all 4  Recent Results (from the past 2160 hour(s))  TSH     Status: Abnormal   Collection Time: 10/19/16  8:01 AM  Result Value Ref Range   TSH 0.17 (L) mIU/L    Comment:   Reference Range   > or = 20 Years  0.40-4.50   Pregnancy Range First trimester  0.26-2.66 Second trimester 0.55-2.73 Third trimester  0.43-2.91     T4, free     Status: None   Collection Time: 10/19/16  8:01 AM  Result Value Ref Range  Free T4 1.4 0.8 - 1.8 ng/dL    Assessment & Plan:   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 Continue LT4 at 125 mcg po qam.  - We discussed about correct intake of levothyroxine, at fasting, with water, separated by at least 30 minutes from breakfast, and separated by more than 4 hours from calcium, iron, multivitamins, acid reflux medications (PPIs). -Patient is made aware of the fact that thyroid hormone replacement is needed for life, dose to be adjusted by periodic monitoring of thyroid function tests.   2. Diabetes mellitus without complication ([place]) - During her last visit A1c was 5.6%.  I advised her to continue metformin 500 mg by mouth once a day. Exercise and carbohydrate management discussed in detail with her. She has been scheduled with Jearld Fenton, CDE for diabetes and nutritional counseling.  3. Essential hypertension, benign -Controlled, continue current medications including lisinopril-hydrochlorthiazide.  4. Hyperlipidemia -Continue Lipitor 20 mg by mouth daily at bedtime.  5. Morbid obesity due to excess calories [facility]): - She has lost 5 pounds since last visit, she says she is on the weight watcher's program. She  didn't want to  continue   Qsymia , even though it was helping. Now she sees her insurance would not pay for it. -See #2 above.   - 25 minutes of time was spent on the care of this patient , 50% of which was applied for counseling on diabetes complications and their preventions.  - I advised patient to maintain close follow up with [facility], MD for primary care needs. Follow up plan: Return in about 6 months (around 04/28/2017) for follow up with pre-visit labs.  Glade Lloyd, MD Phone: [phone]  Fax: [phone]   10/26/2016, 9:03 AM

## 2016-12-03 ENCOUNTER — Other Ambulatory Visit: Payer: Self-pay | Admitting: "Endocrinology

## 2017-04-16 ENCOUNTER — Other Ambulatory Visit (HOSPITAL_COMMUNITY): Payer: Self-pay | Admitting: Internal Medicine

## 2017-04-16 DIAGNOSIS — Z1231 Encounter for screening mammogram for malignant neoplasm of breast: Secondary | ICD-10-CM

## 2017-04-19 LAB — TSH: TSH: 1.74 (ref <=5.90)

## 2017-04-19 LAB — LIPID PANEL
Cholesterol: 178 (ref 0–200)
HDL: 87 — AB (ref 35–70)
LDL CALC: 73
Triglycerides: 93 (ref 40–160)

## 2017-04-19 LAB — BASIC METABOLIC PANEL
BUN: 14 (ref 4–21)
Creatinine: 0.9 (ref <=1.1)

## 2017-04-19 LAB — HEMOGLOBIN A1C: HEMOGLOBIN A1C: 6.1

## 2017-04-26 ENCOUNTER — Ambulatory Visit: Payer: Medicare Other | Admitting: "Endocrinology

## 2017-05-03 ENCOUNTER — Ambulatory Visit (INDEPENDENT_AMBULATORY_CARE_PROVIDER_SITE_OTHER): Payer: Medicare Other | Admitting: "Endocrinology

## 2017-05-03 ENCOUNTER — Encounter: Payer: Self-pay | Admitting: "Endocrinology

## 2017-05-03 VITALS — BP 119/80 | HR 60 | Ht 63.0 in | Wt 257.8 lb

## 2017-05-03 DIAGNOSIS — I10 Essential (primary) hypertension: Secondary | ICD-10-CM | POA: Diagnosis not present

## 2017-05-03 DIAGNOSIS — E782 Mixed hyperlipidemia: Secondary | ICD-10-CM

## 2017-05-03 DIAGNOSIS — E890 Postprocedural hypothyroidism: Secondary | ICD-10-CM | POA: Diagnosis not present

## 2017-05-03 DIAGNOSIS — E119 Type 2 diabetes mellitus without complications: Secondary | ICD-10-CM

## 2017-05-03 MED ORDER — PHENTERMINE-TOPIRAMATE ER 3.75-23 MG PO CP24: 1.0000 | ORAL_CAPSULE | Freq: Every day | ORAL | 0 refills | Status: DC

## 2017-05-03 MED ORDER — PHENTERMINE-TOPIRAMATE ER 7.5-46 MG PO CP24: 1.0000 | ORAL_CAPSULE | Freq: Every day | ORAL | 2 refills | Status: DC

## 2017-05-03 NOTE — Progress Notes (Signed)
Subjective:    Patient ID: Cheyenne Benson, female    DOB: 1945/10/19, PCP Rosita Fire, MD   Past Medical History:  Diagnosis Date  . Arthritis   . Asthma   . Diabetes mellitus without complication ([place])   . Dysrhythmia    "used to speed up a little-when my thyroid wa snot right, I'm on Metorpoolol no- no problem"  . Hypothyroidism    Past Surgical History:  Procedure Laterality Date  . [place] VITRECTOMY WITH 20 GAUGE MVR PORT FOR MACULAR HOLE Right 01/22/2013   Procedure: 25 GAUGE PARS PLANA VITRECTOMY WITH 20 GAUGE MVR PORT FOR MACULAR HOLE RIGHT EYE ;  Surgeon: Hayden Pedro, MD;  Location: [place];  Service: Ophthalmology;  Laterality: Right;  . ABDOMINAL HYSTERECTOMY  1998  . AXILLARY SURGERY Right    removes lump and sweet glands  . buninectom    . BUNIONECTOMY Bilateral   . COLONOSCOPY N/A 06/06/2016   Procedure: COLONOSCOPY;  Surgeon: Daneil Dolin, MD;  [facility];  Service: Endoscopy;  Laterality: N/A;  8:30 AM  . CYSTECTOMY     behind neck--- no anesthesia  . DG STYLOID PROCESS / TEMPORAL BONES    . EYE SURGERY Bilateral 4/20\14   cataract  . GAS/FLUID EXCHANGE Right 01/22/2013   Procedure: GAS/FLUID EXCHANGE;  Surgeon: Hayden Pedro, MD;  Location: [place];  Service: Ophthalmology;  Laterality: Right;  C3F8  . LASER PHOTO ABLATION Right 01/22/2013   Procedure: LASER PHOTO ABLATION;  Surgeon: Hayden Pedro, MD;  Location: [place];  Service: Ophthalmology;  Laterality: Right;  Headscope  . MEMBRANE PEEL Right 01/22/2013   Procedure: MEMBRANE PEEL;  Surgeon: Hayden Pedro, MD;  Location: [place];  Service: Ophthalmology;  Laterality: Right;  . SERUM PATCH Right 01/22/2013   Procedure: SERUM PATCH;  Surgeon: Hayden Pedro, MD;  Location: [place];  Service: Ophthalmology;  Laterality: Right;  . SKIN GRAFT Left    after axillary surgery  . TOE SURGERY     left bones removed from 4 and 5 toe, Right foot 5th toe bone removed   Social History    Social History  . Marital status: Married    Spouse name: N/A  . Number of children: N/A  . Years of education: N/A   Social History Main Topics  . Smoking status: Former Smoker    Years: 2.00  . Smokeless tobacco: Never Used     Comment: maybe smoked a pack a month  . Alcohol use No  . Drug use: No  . Sexual activity: Not Asked   Other Topics Concern  . None   Social History Narrative  . None   Outpatient Encounter Prescriptions as of 05/03/2017  Medication Sig  . ADVAIR DISKUS 250-50 MCG/DOSE AEPB Inhale 1 puff into the lungs daily.  Marland Kitchen albuterol (PROVENTIL) (2.5 MG/3ML) 0.083% nebulizer solution Take 2.5 mg by nebulization every 6 (six) hours as needed for wheezing or shortness of breath.  Marland Kitchen aspirin EC 81 MG tablet Take 81 mg by mouth daily.  Marland Kitchen atorvastatin (LIPITOR) 20 MG tablet Take 20 mg by mouth daily.  . budesonide-formoterol (SYMBICORT) 160-4.5 MCG/ACT inhaler Inhale 2 puffs into the lungs 2 (two) times daily as needed (shortness of breath). When advair runs out  . Calcium Carbonate Antacid (ANTACID PO) Take 1 tablet by mouth daily as needed (acid reflux).  . Carboxymethylcellulose Sodium (THERATEARS OP) Apply 1-2 drops to eye 2 (two) times  daily as needed (dry eyes). 2 drops in the right eye and 1 drop in the left with every use  . Diphenhydramine-PSE-APAP (ALLERGY/SINUS HEADACHE PO) Take 2 tablets by mouth daily as needed (sinus headaches).  Marland Kitchen levothyroxine (SYNTHROID, LEVOTHROID) 125 MCG tablet Take 1 tablet (125 mcg total) by mouth daily.  Marland Kitchen levothyroxine (SYNTHROID, LEVOTHROID) 125 MCG tablet TAKE ONE TABLET BY MOUTH ONCE DAILY  . lisinopril-hydrochlorothiazide (PRINZIDE,ZESTORETIC) 20-12.5 MG tablet Take 1 tablet by mouth daily.  . meclizine (ANTIVERT) 25 MG tablet Take 25 mg by mouth 3 (three) times daily as needed for dizziness.  . metFORMIN (GLUCOPHAGE) 500 MG tablet Take 1 tablet (500 mg total) by mouth 2 (two) times daily with a meal. (Patient taking  differently: Take 500 mg by mouth daily. )  . metoprolol tartrate (LOPRESSOR) 25 MG tablet Take 25 mg by mouth daily.  . Multiple Vitamin (MULTIVITAMIN WITH MINERALS) TABS Take 1 tablet by mouth daily.  . polyethylene glycol-electrolytes (TRILYTE) 420 g solution Take 4,000 mLs by mouth as directed.  . [DISCONTINUED] albuterol (PROVENTIL HFA;VENTOLIN HFA) 108 (90 Base) MCG/ACT inhaler Inhale 1-2 puffs into the lungs every 6 (six) hours as needed for wheezing or shortness of breath.   . [DISCONTINUED] lisinopril-hydrochlorothiazide (PRINZIDE,ZESTORETIC) 10-12.5 MG per tablet Take 1 tablet by mouth daily.  . Phentermine-Topiramate (QSYMIA) 7.5-46 MG CP24 Take 1 capsule by mouth daily.  . Phentermine-Topiramate 3.75-23 MG CP24 Take 1 capsule by mouth daily.   No facility-administered encounter medications on file as of 05/03/2017.    ALLERGIES: Allergies  Allergen Reactions  . Adhesive [Tape]     redness  . Codeine Nausea Only    Upset stomach    VACCINATION STATUS:  There is no immunization history on file for this patient.  HPI  71 yr old female with medical hx of GD s/p RAI therapy approximately at age of 43 years .  She is here for follow-up of  iodine induced hypothyroidism, and type 2 DM. She is currently on Levothyroxine 125 mcg po qam, and  metformin  500 mg by mouth once a day.  - She has been overweight/obese most of her adult life. - She was treated briefly with qymia which was helping her lose weight, she is requesting a prescription for it again.  She denies any active symptoms. She has family hx of hypothyroidism in her mother. Denies heat/cold intolerance.   Review of Systems   Constitutional: +  weight gain , no fatigue, no subjective hyperthermia/hypothermia Eyes: no blurry vision, no xerophthalmia ENT: no sore throat, no nodules palpated in throat, no dysphagia/odynophagia, no hoarseness Cardiovascular: no CP/SOB/palpitations/leg swelling Respiratory: no  cough/SOB Gastrointestinal: no N/V/D/C Musculoskeletal: no muscle/joint aches Skin: no rashes Neurological: no tremors/numbness/tingling/dizziness Psychiatric: no depression/anxiety  Objective:    BP 119/80   Pulse 60   Ht 5\' 3"  (1.6 m)   Wt 257 lb 12.8 oz (116.9 kg)   SpO2 96%   BMI 45.67 kg/m   Wt Readings from Last 3 Encounters:  05/03/17 257 lb 12.8 oz (116.9 kg)  10/26/16 253 lb (114.8 kg)  07/26/16 258 lb (117 kg)    Physical Exam   Constitutional: Obese, in NAD Eyes: PERRLA, EOMI, no exophthalmos ENT: moist mucous membranes, no thyromegaly, no cervical lymphadenopathy Cardiovascular: RRR, No MRG Respiratory: CTA B Gastrointestinal: abdomen soft, NT, ND, BS+ Musculoskeletal: no deformities, strength intact in all 4 Skin: moist, warm, no rashes Neurological: no tremor with outstretched hands, DTR normal in all 4  Recent Results (from  the past 2160 hour(s))  Basic metabolic panel     Status: None   Collection Time: 04/19/17 12:00 AM  Result Value Ref Range   BUN 14 4 - 21   Creatinine 0.9 0.5 - 1.1  Lipid panel     Status: Abnormal   Collection Time: 04/19/17 12:00 AM  Result Value Ref Range   Triglycerides 93 40 - 160   Cholesterol 178 0 - 200   HDL 87 (A) 35 - 70   LDL Cholesterol 73   Hemoglobin A1c     Status: None   Collection Time: 04/19/17 12:00 AM  Result Value Ref Range   Hemoglobin A1C 6.1   TSH     Status: None   Collection Time: 04/19/17 12:00 AM  Result Value Ref Range   TSH 1.74 0.41 - 5.90     Assessment & Plan:   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 I advised her to continue levothyroxine 125 g by mouth every morning.    - We discussed about correct intake of levothyroxine, at fasting, with water, separated by at least 30 minutes from breakfast, and separated by more than 4 hours from calcium, iron, multivitamins, acid reflux medications (PPIs). -Patient is  made aware of the fact that thyroid hormone replacement is needed for life, dose to be adjusted by periodic monitoring of thyroid function tests.   2. Diabetes mellitus without complication ([place]) - her repeat A1c is stable at  6.1%.  I advised her to continue metformin 500 mg by mouth once a day. Exercise and carbohydrate management discussed in detail with her. She has been scheduled with Jearld Fenton, CDE for diabetes and nutritional counseling.  3. Essential hypertension, benign -Controlled, continue current medications including lisinopril-hydrochlorthiazide, and metoprolol.   4. Hyperlipidemia -Continue Lipitor 20 mg by mouth daily at bedtime.  5. Morbid obesity due to excess calories [facility]): - She wishes to resume  Qsymia. I have discussed in prescribed. Side effects and precautions discussed with her. She has previously benefited from this medication.   - Time spent with the patient: 25 min, of which >50% was spent in reviewing her sugar logs , discussing her hypo- and hyper-glycemic episodes, reviewing her current and  previous labs and insulin doses and developing a plan to avoid hypo- and hyper-glycemia.    - I advised patient to maintain close follow up with Rosita Fire, MD for primary care needs. Follow up plan: Return in about 3 months (around 08/02/2017) for follow up with pre-visit labs.  Glade Lloyd, MD Phone: [phone]  Fax: [phone]  This note was partially dictated with voice recognition software. Similar sounding words can be transcribed inadequately or may not  be corrected upon review.  05/03/2017, 12:30 PM

## 2017-05-07 ENCOUNTER — Other Ambulatory Visit: Payer: Self-pay

## 2017-05-07 MED ORDER — PHENTERMINE-TOPIRAMATE ER 3.75-23 MG PO CP24: 1.0000 | ORAL_CAPSULE | Freq: Every day | ORAL | 0 refills | Status: DC

## 2017-05-09 ENCOUNTER — Ambulatory Visit (HOSPITAL_COMMUNITY)
Admission: RE | Admit: 2017-05-09 | Discharge: 2017-05-09 | Disposition: A | Discharge status: Home / Self Care | Payer: Medicare Other | Source: Ambulatory Visit | Attending: Internal Medicine | Admitting: Internal Medicine

## 2017-05-09 DIAGNOSIS — Z1231 Encounter for screening mammogram for malignant neoplasm of breast: Secondary | ICD-10-CM | POA: Diagnosis not present

## 2017-06-06 ENCOUNTER — Other Ambulatory Visit: Payer: Self-pay | Admitting: "Endocrinology

## 2017-08-02 ENCOUNTER — Ambulatory Visit: Payer: Medicare Other | Admitting: "Endocrinology

## 2017-08-19 ENCOUNTER — Ambulatory Visit: Payer: Medicare Other | Admitting: "Endocrinology

## 2017-08-23 LAB — T4, FREE: Free T4: 1.3 ng/dL (ref 0.8–1.8)

## 2017-08-23 LAB — TSH: TSH: 0.84 m[IU]/L (ref 0.40–4.50)

## 2017-08-23 LAB — VITAMIN D 25 HYDROXY (VIT D DEFICIENCY, FRACTURES): VIT D 25 HYDROXY: 38 ng/mL (ref 30–100)

## 2017-08-30 ENCOUNTER — Ambulatory Visit (INDEPENDENT_AMBULATORY_CARE_PROVIDER_SITE_OTHER): Payer: Medicare Other | Admitting: "Endocrinology

## 2017-08-30 ENCOUNTER — Encounter: Payer: Self-pay | Admitting: "Endocrinology

## 2017-08-30 VITALS — BP 128/77 | HR 58 | Ht 63.0 in | Wt 256.0 lb

## 2017-08-30 DIAGNOSIS — I10 Essential (primary) hypertension: Secondary | ICD-10-CM

## 2017-08-30 DIAGNOSIS — E119 Type 2 diabetes mellitus without complications: Secondary | ICD-10-CM | POA: Diagnosis not present

## 2017-08-30 DIAGNOSIS — E890 Postprocedural hypothyroidism: Secondary | ICD-10-CM | POA: Diagnosis not present

## 2017-08-30 DIAGNOSIS — E782 Mixed hyperlipidemia: Secondary | ICD-10-CM | POA: Diagnosis not present

## 2017-08-30 NOTE — Progress Notes (Signed)
Subjective:    Patient ID: Cheyenne Benson, female    DOB: 10/26/1945, PCP Rosita Fire, MD   Past Medical History:  Diagnosis Date  . Arthritis   . Asthma   . Diabetes mellitus without complication ([place])   . Dysrhythmia    "used to speed up a little-when my thyroid wa snot right, I'm on Metorpoolol no- no problem"  . Hypothyroidism    Past Surgical History:  Procedure Laterality Date  . [place] VITRECTOMY WITH 20 GAUGE MVR PORT FOR MACULAR HOLE Right 01/22/2013   Procedure: 25 GAUGE PARS PLANA VITRECTOMY WITH 20 GAUGE MVR PORT FOR MACULAR HOLE RIGHT EYE ;  Surgeon: Hayden Pedro, MD;  Location: [place];  Service: Ophthalmology;  Laterality: Right;  . ABDOMINAL HYSTERECTOMY  1998  . AXILLARY SURGERY Right    removes lump and sweet glands  . buninectom    . BUNIONECTOMY Bilateral   . COLONOSCOPY N/A 06/06/2016   Procedure: COLONOSCOPY;  Surgeon: Daneil Dolin, MD;  [facility];  Service: Endoscopy;  Laterality: N/A;  8:30 AM  . CYSTECTOMY     behind neck--- no anesthesia  . DG STYLOID PROCESS / TEMPORAL BONES    . EYE SURGERY Bilateral 4/20\14   cataract  . GAS/FLUID EXCHANGE Right 01/22/2013   Procedure: GAS/FLUID EXCHANGE;  Surgeon: Hayden Pedro, MD;  Location: [place];  Service: Ophthalmology;  Laterality: Right;  C3F8  . LASER PHOTO ABLATION Right 01/22/2013   Procedure: LASER PHOTO ABLATION;  Surgeon: Hayden Pedro, MD;  Location: [place];  Service: Ophthalmology;  Laterality: Right;  Headscope  . MEMBRANE PEEL Right 01/22/2013   Procedure: MEMBRANE PEEL;  Surgeon: Hayden Pedro, MD;  Location: [place];  Service: Ophthalmology;  Laterality: Right;  . SERUM PATCH Right 01/22/2013   Procedure: SERUM PATCH;  Surgeon: Hayden Pedro, MD;  Location: [place];  Service: Ophthalmology;  Laterality: Right;  . SKIN GRAFT Left    after axillary surgery  . TOE SURGERY     left bones removed from 4 and 5 toe, Right foot 5th toe bone removed   Social History    Socioeconomic History  . Marital status: Married    Spouse name: None  . Number of children: None  . Years of education: None  . Highest education level: None  Social Needs  . Financial resource strain: None  . Food insecurity - worry: None  . Food insecurity - inability: None  . Transportation needs - medical: None  . Transportation needs - non-medical: None  Occupational History  . None  Tobacco Use  . Smoking status: Former Smoker    Years: 2.00  . Smokeless tobacco: Never Used  . Tobacco comment: maybe smoked a pack a month  Substance and Sexual Activity  . Alcohol use: No  . Drug use: No  . Sexual activity: None  Other Topics Concern  . None  Social History Narrative  . None   Outpatient Encounter Medications as of 08/30/2017  Medication Sig  . ADVAIR DISKUS 250-50 MCG/DOSE AEPB Inhale 1 puff into the lungs daily.  Marland Kitchen albuterol (PROVENTIL) (2.5 MG/3ML) 0.083% nebulizer solution Take 2.5 mg by nebulization every 6 (six) hours as needed for wheezing or shortness of breath.  Marland Kitchen aspirin EC 81 MG tablet Take 81 mg by mouth daily.  Marland Kitchen atorvastatin (LIPITOR) 20 MG tablet Take 20 mg by mouth daily.  Marland Kitchen levothyroxine (SYNTHROID, LEVOTHROID) 125 MCG tablet Take 1  tablet (125 mcg total) by mouth daily.  Marland Kitchen lisinopril-hydrochlorothiazide (PRINZIDE,ZESTORETIC) 20-12.5 MG tablet Take 1 tablet by mouth daily.  . metFORMIN (GLUCOPHAGE) 500 MG tablet Take 1 tablet (500 mg total) by mouth 2 (two) times daily with a meal. (Patient taking differently: Take 500 mg by mouth daily. )  . metoprolol tartrate (LOPRESSOR) 25 MG tablet Take 25 mg by mouth daily.  . Multiple Vitamin (MULTIVITAMIN WITH MINERALS) TABS Take 1 tablet by mouth daily.  . Phentermine-Topiramate (QSYMIA) 7.5-46 MG CP24 Take 1 capsule by mouth daily.  . [DISCONTINUED] budesonide-formoterol (SYMBICORT) 160-4.5 MCG/ACT inhaler Inhale 2 puffs into the lungs 2 (two) times daily as needed (shortness of breath). When advair runs out   . [DISCONTINUED] Calcium Carbonate Antacid (ANTACID PO) Take 1 tablet by mouth daily as needed (acid reflux).  . [DISCONTINUED] Carboxymethylcellulose Sodium (THERATEARS OP) Apply 1-2 drops to eye 2 (two) times daily as needed (dry eyes). 2 drops in the right eye and 1 drop in the left with every use  . [DISCONTINUED] Diphenhydramine-PSE-APAP (ALLERGY/SINUS HEADACHE PO) Take 2 tablets by mouth daily as needed (sinus headaches).  . [DISCONTINUED] levothyroxine (SYNTHROID, LEVOTHROID) 125 MCG tablet TAKE 1 TABLET BY MOUTH ONCE DAILY  . [DISCONTINUED] meclizine (ANTIVERT) 25 MG tablet Take 25 mg by mouth 3 (three) times daily as needed for dizziness.  . [DISCONTINUED] Phentermine-Topiramate 3.75-23 MG CP24 Take 1 capsule by mouth daily.  . [DISCONTINUED] polyethylene glycol-electrolytes (TRILYTE) 420 g solution Take 4,000 mLs by mouth as directed.   No facility-administered encounter medications on file as of 08/30/2017.    ALLERGIES: Allergies  Allergen Reactions  . Adhesive [Tape]     redness  . Codeine Nausea Only    Upset stomach    VACCINATION STATUS:  There is no immunization history on file for this patient.  HPI  72 yr old female with medical hx of GD  STATUS POST RAI therapy approximately at age of 5 years .  She is here for follow-up of  iodine induced hypothyroidism, and type 2 DM, obesity, hyperlipidemia.  She is currently on Levothyroxine 125 mcg po qam, and  metformin  500 mg by mouth once a day.   - She is also on Qsymia 7.5/46 milligrams for weight control. - She has been overweight/obese most of her adult life. She did not lose any significant amount of weight since last visit. - She has no new complaints today. She denies cold/heat intolerance, palpitations, tremors, chest pain, shortness of breath. - She has family hx of hypothyroidism in her mother.   Review of Systems   Constitutional: + Heavy weight  , no fatigue, no subjective hyperthermia/hypothermia Eyes:No  blurry vision, no xerophthalmia. no dysphagia/odynophagia, no hoarseness Cardiovascular:   no chest pain, no shortness of breath, no palpitations, no leg swelling   Respiratory: no cough/SOB Gastrointestinal: no N/V/D/C Musculoskeletal: no muscle/joint aches Skin: no rashes Neurological: No tremors, no numbness, no tingling, no dizziness.  Psychiatric: no depression/anxiety  Objective:    BP 128/77   Pulse (!) 58   Ht 5\' 3"  (1.6 m)   Wt 256 lb (116.1 kg)   BMI 45.35 kg/m   Wt Readings from Last 3 Encounters:  08/30/17 256 lb (116.1 kg)  05/03/17 257 lb 12.8 oz (116.9 kg)  10/26/16 253 lb (114.8 kg)    Physical Exam   Constitutional:  obese, not in acute distress.  Eyes: PERRLA, EOMI, no exophthalmos ENT: Moist mucous membranes, no thyromegaly, no cervical lymphadenopathy.  Cardiovascular: RRR, No MRG  Respiratory: CTA B Gastrointestinal: abdomen is obese,  soft, nontender, bowel sounds positive. Musculoskeletal: no deformities, strength intact in all 4 Skin: moist, warm, no rashes Neurological:   no tremors of the outstretched hands, DTR reflexes are within normal limits.   Recent Results (from the past 2160 hour(s))  T4, free     Status: None   Collection Time: 08/22/17 10:09 AM  Result Value Ref Range   Free T4 1.3 0.8 - 1.8 ng/dL  TSH     Status: None   Collection Time: 08/22/17 10:09 AM  Result Value Ref Range   TSH 0.84 0.40 - 4.50 mIU/L  VITAMIN D 25 Hydroxy (Vit-D Deficiency, Fractures)     Status: None   Collection Time: 08/22/17 10:09 AM  Result Value Ref Range   Vit D, 25-Hydroxy 38 30 - 100 ng/mL    Comment: Vitamin D Status         25-OH Vitamin D: . Deficiency:                    <20 ng/mL Insufficiency:             20 - 29 ng/mL Optimal:                 > or = 30 ng/mL . For 25-OH Vitamin D testing on patients on  D2-supplementation and patients for whom quantitation  of D2 and D3 fractions is required, the QuestAssureD(TM) 25-OH VIT D, (D2,D3),  LC/MS/MS is recommended: order  code 617-213-5056 (patients >48yrs). . 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 - I advised her to continue  levothyroxine 125 g by mouth every morning.    - We discussed about correct intake of levothyroxine, at fasting, with water, separated by at least 30 minutes from breakfast, and separated by more than 4 hours from calcium, iron, multivitamins, acid reflux medications (PPIs). -Patient is made aware of the fact that thyroid hormone replacement is needed for life, dose to be adjusted by periodic monitoring of thyroid function tests.  2. Diabetes mellitus without complication ([facility]) - Her recent A1c has been  stable at  6.1%.  I advised her to continue metformin 500 mg by mouth once a day.   3. Essential hypertension, benign - Her blood pressure is controlled to target. I advised her to continue her current blood pressure medications including  lisinopril-hydrochlorthiazide, and metoprolol.   4. Hyperlipidemia - From September 2018 her lipid profile was favorable including HDL of 87 and LDL of 73. I advised her to continue Lipitor 20 mg by mouth daily at bedtime. Side effects and precautions discussed with her.    5. Morbid obesity due to excess calories ([place]): -  she has not achieved any significant weight loss on   Qsymia since last visit.   - At this time, I decided to discontinue this medication for her.  - Bariatric surgery was briefly discussed with her however she is not interested at this time.  - I have discussed lifestyle modification once again in detail with her. -  Suggestion is made for her to avoid simple carbohydrates  from her diet including Cakes, Sweet Desserts / Pastries, Ice  Cream, Soda (diet and regular), Sweet Tea, Candies, Chips, Cookies, Store Bought Juices, Alcohol  in Excess of  1-2 drinks a day, Artificial Sweeteners, and "Sugar-free" Products. This will help patient to have stable blood glucose profile and potentially avoid unintended weight gain. - Exercise involving long walks of 30-60 minutes 4 days a week advised.  - Time spent with the patient: 25 min, of which >50% was spent in reviewing her  current and  previous labs, previous treatments, and medications  doses and developing a plan for long-term care.    - I advised patient to maintain close follow up with Rosita Fire, MD for primary care needs. Follow up plan: Return in about 6 months (around 02/27/2018).  Glade Lloyd, MD Phone: [phone]  Fax: [phone]  This note was partially dictated with voice recognition software. Similar sounding words can be transcribed inadequately or may not  be corrected upon review.  08/30/2017, 9:01 AM

## 2017-08-30 NOTE — Patient Instructions (Signed)

## 2017-12-09 ENCOUNTER — Other Ambulatory Visit: Payer: Self-pay | Admitting: "Endocrinology

## 2018-01-27 ENCOUNTER — Other Ambulatory Visit (HOSPITAL_COMMUNITY): Payer: Self-pay | Admitting: Internal Medicine

## 2018-01-27 DIAGNOSIS — Z1231 Encounter for screening mammogram for malignant neoplasm of breast: Secondary | ICD-10-CM

## 2018-02-21 LAB — TSH: TSH: 0.25 m[IU]/L — AB (ref 0.40–4.50)

## 2018-02-21 LAB — COMPLETE METABOLIC PANEL WITH GFR
AG RATIO: 1.3 (calc) (ref 1.0–2.5)
ALBUMIN MSPROF: 4 g/dL (ref 3.6–5.1)
ALT: 13 U/L (ref 6–29)
AST: 16 U/L (ref 10–35)
Alkaline phosphatase (APISO): 81 U/L (ref 33–130)
BUN: 19 mg/dL (ref 7–25)
CALCIUM: 9.3 mg/dL (ref 8.6–10.4)
CO2: 30 mmol/L (ref 20–32)
CREATININE: 0.79 mg/dL (ref 0.60–0.93)
Chloride: 106 mmol/L (ref 98–110)
GFR, EST AFRICAN AMERICAN: 88 mL/min/{1.73_m2} (ref >=60)
GFR, EST NON AFRICAN AMERICAN: 76 mL/min/{1.73_m2} (ref >=60)
GLOBULIN: 3.1 g/dL (ref 1.9–3.7)
Glucose, Bld: 100 mg/dL (ref 65–139)
POTASSIUM: 3.7 mmol/L (ref 3.5–5.3)
SODIUM: 143 mmol/L (ref 135–146)
TOTAL PROTEIN: 7.1 g/dL (ref 6.1–8.1)
Total Bilirubin: 0.4 mg/dL (ref 0.2–1.2)

## 2018-02-21 LAB — HEMOGLOBIN A1C
Hgb A1c MFr Bld: 5.5 % of total Hgb (ref <5.7)
MEAN PLASMA GLUCOSE: 111 (calc)
eAG (mmol/L): 6.2 (calc)

## 2018-02-21 LAB — T4, FREE: FREE T4: 1.4 ng/dL (ref 0.8–1.8)

## 2018-02-27 ENCOUNTER — Ambulatory Visit (INDEPENDENT_AMBULATORY_CARE_PROVIDER_SITE_OTHER): Payer: Medicare Other | Admitting: "Endocrinology

## 2018-02-27 ENCOUNTER — Encounter: Payer: Self-pay | Admitting: "Endocrinology

## 2018-02-27 VITALS — BP 133/84 | HR 53 | Ht 63.0 in | Wt 236.0 lb

## 2018-02-27 DIAGNOSIS — E782 Mixed hyperlipidemia: Secondary | ICD-10-CM

## 2018-02-27 DIAGNOSIS — E890 Postprocedural hypothyroidism: Secondary | ICD-10-CM

## 2018-02-27 DIAGNOSIS — I10 Essential (primary) hypertension: Secondary | ICD-10-CM | POA: Diagnosis not present

## 2018-02-27 MED ORDER — LEVOTHYROXINE SODIUM 112 MCG PO TABS: 112.0000 ug | ORAL_TABLET | Freq: Every day | ORAL | 2 refills | Status: DC

## 2018-02-27 NOTE — Patient Instructions (Signed)

## 2018-02-27 NOTE — Progress Notes (Signed)
Endocrinology follow-up note  Subjective:    Patient ID: Cheyenne Benson, female    DOB: May 25, 1946, PCP Rosita Fire, MD   Past Medical History:  Diagnosis Date  . Arthritis   . Asthma   . Diabetes mellitus without complication ([place])   . Dysrhythmia    "used to speed up a little-when my thyroid wa snot right, I'm on Metorpoolol no- no problem"  . Hypothyroidism    Past Surgical History:  Procedure Laterality Date  . [place] VITRECTOMY WITH 20 GAUGE MVR PORT FOR MACULAR HOLE Right 01/22/2013   Procedure: 25 GAUGE PARS PLANA VITRECTOMY WITH 20 GAUGE MVR PORT FOR MACULAR HOLE RIGHT EYE ;  Surgeon: Hayden Pedro, MD;  Location: [place];  Service: Ophthalmology;  Laterality: Right;  . ABDOMINAL HYSTERECTOMY  1998  . AXILLARY SURGERY Right    removes lump and sweet glands  . buninectom    . BUNIONECTOMY Bilateral   . COLONOSCOPY N/A 06/06/2016   Procedure: COLONOSCOPY;  Surgeon: Daneil Dolin, MD;  [facility];  Service: Endoscopy;  Laterality: N/A;  8:30 AM  . CYSTECTOMY     behind neck--- no anesthesia  . DG STYLOID PROCESS / TEMPORAL BONES    . EYE SURGERY Bilateral 4/20\14   cataract  . GAS/FLUID EXCHANGE Right 01/22/2013   Procedure: GAS/FLUID EXCHANGE;  Surgeon: Hayden Pedro, MD;  Location: [place];  Service: Ophthalmology;  Laterality: Right;  C3F8  . LASER PHOTO ABLATION Right 01/22/2013   Procedure: LASER PHOTO ABLATION;  Surgeon: Hayden Pedro, MD;  Location: [place];  Service: Ophthalmology;  Laterality: Right;  Headscope  . MEMBRANE PEEL Right 01/22/2013   Procedure: MEMBRANE PEEL;  Surgeon: Hayden Pedro, MD;  Location: [place];  Service: Ophthalmology;  Laterality: Right;  . SERUM PATCH Right 01/22/2013   Procedure: SERUM PATCH;  Surgeon: Hayden Pedro, MD;  Location: [place];  Service: Ophthalmology;  Laterality: Right;  . SKIN GRAFT Left    after axillary surgery  . TOE SURGERY     left bones removed from 4 and 5 toe,  Right foot 5th toe bone removed   Social History   Socioeconomic History  . Marital status: Married    Spouse name: Not on file  . Number of children: Not on file  . Years of education: Not on file  . Highest education level: Not on file  Occupational History  . Not on file  Social Needs  . Financial resource strain: Not on file  . Food insecurity:    Worry: Not on file    Inability: Not on file  . Transportation needs:    Medical: Not on file    Non-medical: Not on file  Tobacco Use  . Smoking status: Former Smoker    Years: 2.00  . Smokeless tobacco: Never Used  . Tobacco comment: maybe smoked a pack a month  Substance and Sexual Activity  . Alcohol use: No  . Drug use: No  . Sexual activity: Not on file  Lifestyle  . Physical activity:    Days per week: Not on file    Minutes per session: Not on file  . Stress: Not on file  Relationships  . Social connections:    Talks on phone: Not on file    Gets together: Not on file    Attends religious  service: Not on file    Active member of club or organization: Not on file    Attends meetings of clubs or organizations: Not on file    Relationship status: Not on file  Other Topics Concern  . Not on file  Social History Narrative  . Not on file   Outpatient Encounter Medications as of 02/27/2018  Medication Sig  . ADVAIR DISKUS 250-50 MCG/DOSE AEPB Inhale 1 puff into the lungs daily.  Marland Kitchen aspirin EC 81 MG tablet Take 81 mg by mouth daily.  . hydrochlorothiazide (HYDRODIURIL) 12.5 MG tablet Take 12.5 mg by mouth daily.  Marland Kitchen levothyroxine (SYNTHROID, LEVOTHROID) 112 MCG tablet Take 1 tablet (112 mcg total) by mouth daily.  . metFORMIN (GLUCOPHAGE) 500 MG tablet Take 1 tablet (500 mg total) by mouth 2 (two) times daily with a meal. (Patient taking differently: Take 500 mg by mouth daily. )  . metoprolol tartrate (LOPRESSOR) 50 MG tablet Take 50 mg by mouth daily.   . Multiple Vitamin (MULTIVITAMIN WITH MINERALS) TABS Take 1  tablet by mouth daily.  . [DISCONTINUED] levothyroxine (SYNTHROID, LEVOTHROID) 125 MCG tablet Take 1 tablet (125 mcg total) by mouth daily.  . [DISCONTINUED] albuterol (PROVENTIL) (2.5 MG/3ML) 0.083% nebulizer solution Take 2.5 mg by nebulization every 6 (six) hours as needed for wheezing or shortness of breath.  . [DISCONTINUED] atorvastatin (LIPITOR) 20 MG tablet Take 20 mg by mouth daily.  . [DISCONTINUED] levothyroxine (SYNTHROID, LEVOTHROID) 125 MCG tablet TAKE 1 TABLET BY MOUTH ONCE DAILY  . [DISCONTINUED] lisinopril-hydrochlorothiazide (PRINZIDE,ZESTORETIC) 20-12.5 MG tablet Take 1 tablet by mouth daily.   No facility-administered encounter medications on file as of 02/27/2018.    ALLERGIES: Allergies  Allergen Reactions  . Adhesive [Tape]     redness  . Codeine Nausea Only    Upset stomach   . Lisinopril   . Peanut-Containing Drug Products   . Shrimp [Shellfish Allergy]    VACCINATION STATUS:  There is no immunization history on file for this patient.  HPI  72 year old female  with medical history of Graves' disease status post radioactive iodine ablation at approximately 72 years of age.     She is here for follow-up of  iodine induced hypothyroidism, type 2 DM, obesity, and hyperlipidemia.  She is currently on Levothyroxine 125 mcg po qam, and  metformin  500 mg by mouth once a day.  - She has been overweight/obese most of her adult life. She has achieved 20 pounds of intentional weight loss since last visit.  She reports that she feels better, has more energy. - She has no new complaints today. She denies cold/heat intolerance, palpitations, tremors, chest pain, shortness of breath. - She has family history of hypothyroidism in her mother.      Review of Systems   Constitutional: + Heavy weight  , no fatigue, no subjective hyperthermia/hypothermia Eyes:No blurry vision, no xerophthalmia. no dysphagia/odynophagia, no hoarseness Cardiovascular:   No chest pain, no  palpitations, no shortness of breath.   No chest pain, no shortness of breath, no palpitations, no leg swelling Respiratory: no cough/SOB Gastrointestinal: no N/V/D/C Musculoskeletal: no muscle/joint aches Skin: no rashes Neurological: No tremors, no numbness, no tingling, no dizziness.  Psychiatric: no depression/anxiety  Objective:    BP 133/84   Pulse (!) 53   Ht 5\' 3"  (1.6 m)   Wt 236 lb (107 kg)   BMI 41.81 kg/m   Wt Readings from Last 3 Encounters:  02/27/18 236 lb (107 kg)  08/30/17 256 lb (  116.1 kg)  05/03/17 257 lb 12.8 oz (116.9 kg)    Physical Exam   Constitutional:  +obese, not in acute distress.  Eyes: PERRLA, EOMI, no exophthalmos ENT: Moist mucous membranes, no thyromegaly, no cervical lymphadenopathy.   Musculoskeletal: no deformities, strength intact in all 4 Skin: moist, warm, no rashes Neurological:   no tremors of the outstretched hands   Recent Results (from the past 2160 hour(s))  COMPLETE METABOLIC PANEL WITH GFR     Status: None   Collection Time: 02/20/18  9:14 AM  Result Value Ref Range   Glucose, Bld 100 65 - 139 mg/dL    Comment: .        Non-fasting reference interval .    BUN 19 7 - 25 mg/dL   Creat 0.79 0.60 - 0.93 mg/dL    Comment: For patients >61 years of age, the reference limit for Creatinine is approximately 13% higher for people identified as African-American. .    GFR, Est Non African American 76 > OR = 60 mL/min/1.42m2   GFR, Est African American 88 > OR = 60 mL/min/1.71m2   BUN/Creatinine Ratio NOT APPLICABLE 6 - 22 (calc)   Sodium 143 135 - 146 mmol/L   Potassium 3.7 3.5 - 5.3 mmol/L   Chloride 106 98 - 110 mmol/L   CO2 30 20 - 32 mmol/L   Calcium 9.3 8.6 - 10.4 mg/dL   Total Protein 7.1 6.1 - 8.1 g/dL   Albumin 4.0 3.6 - 5.1 g/dL   Globulin 3.1 1.9 - 3.7 g/dL (calc)   AG Ratio 1.3 1.0 - 2.5 (calc)   Total Bilirubin 0.4 0.2 - 1.2 mg/dL   Alkaline phosphatase (APISO) 81 33 - 130 U/L   AST 16 10 - 35 U/L   ALT 13 6  - 29 U/L  Hemoglobin A1c     Status: None   Collection Time: 02/20/18  9:14 AM  Result Value Ref Range   Hgb A1c MFr Bld 5.5 <5.7 % of total Hgb    Comment: For the purpose of screening for the presence of diabetes: . <5.7%       Consistent with the absence of diabetes 5.7-6.4%    Consistent with increased risk for diabetes             (prediabetes) > or =6.5%  Consistent with diabetes . This assay result is consistent with a decreased risk of diabetes. . Currently, no consensus exists regarding use of hemoglobin A1c for diagnosis of diabetes in children. . According to American Diabetes Association (ADA) guidelines, hemoglobin A1c <7.0% represents optimal control in non-pregnant diabetic patients. Different metrics may apply to specific patient populations.  Standards of Medical Care in Diabetes(ADA). .    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   - I approached her with lower dose of levothyroxine and she accepts.  -I will prescribe levothyroxine 112 mcg p.o. daily before breakfast .   - We discussed about correct intake  of levothyroxine, at fasting, with water, separated by at least 30 minutes from breakfast, and separated by more than 4 hours from calcium, iron, multivitamins, acid reflux medications (PPIs). -Patient is made aware of the fact that thyroid hormone replacement is needed for life, dose to be adjusted by periodic monitoring of thyroid function tests.   2. Diabetes mellitus without complication ([place]) -Her labs show her recent A1c is 5.5%.  She is advised to continue  metformin 500 mg p.o. daily after breakfast.   -  Suggestion is made for her to avoid simple carbohydrates  from her diet including Cakes, Sweet Desserts / Pastries, Ice Cream, Soda (diet and regular), Sweet Tea, Candies, Chips, Cookies, Store Bought Juices, Alcohol in Excess of  1-2 drinks a day, Artificial Sweeteners, and "Sugar-free" Products. This will help patient to have stable blood glucose profile and potentially avoid unintended weight gain.  3. Essential hypertension, benign - Her blood pressure is controlled to target. I advised her to continue her current blood pressure medications including  lisinopril-hydrochlorthiazide, and metoprolol.   4. Hyperlipidemia - From September 2018 her lipid profile was favorable including HDL of 87 and LDL of 73. I advised her to continue Lipitor 20 mg by mouth daily at bedtime. Side effects and precautions discussed with her.    5. Morbid obesity due to excess calories ([place]): -  she has achieved intentional weight loss of 20 pounds since last visit.  She is encouraged to continue with carbs restricted diet, so she can achieve even more weight loss.   - Bariatric surgery was briefly discussed with her however she is not interested at this time.   - Exercise involving long walks of 30-60 minutes 4 days a week advised.  - I advised patient to maintain close follow up with Rosita Fire, MD for primary care needs. - Time spent with the patient: 25 min, of which >50% was spent in reviewing her  current and  previous labs, previous treatments, and medications doses and developing a plan for long-term care.  Cheyenne Benson participated in the discussions, expressed understanding, and voiced agreement with the above plans.  All questions were answered to her satisfaction. she is encouraged to contact clinic should she have any questions or concerns prior to her return visit.  Follow up plan: Return in about 6 months (around 08/30/2018) for follow up with  pre-visit labs.  Glade Lloyd, MD Phone: [phone]  Fax: [phone]  This note was partially dictated with voice recognition software. Similar sounding words can be transcribed inadequately or may not  be corrected upon review.  02/27/2018, 10:04 AM

## 2018-05-12 ENCOUNTER — Ambulatory Visit (HOSPITAL_COMMUNITY)
Admission: RE | Admit: 2018-05-12 | Discharge: 2018-05-12 | Disposition: A | Discharge status: Home / Self Care | Payer: Medicare Other | Source: Ambulatory Visit | Attending: Internal Medicine | Admitting: Internal Medicine

## 2018-05-12 ENCOUNTER — Other Ambulatory Visit (HOSPITAL_COMMUNITY): Payer: Self-pay | Admitting: Internal Medicine

## 2018-05-12 DIAGNOSIS — Z1231 Encounter for screening mammogram for malignant neoplasm of breast: Secondary | ICD-10-CM | POA: Insufficient documentation

## 2018-06-23 ENCOUNTER — Ambulatory Visit (INDEPENDENT_AMBULATORY_CARE_PROVIDER_SITE_OTHER): Payer: Medicare Other | Admitting: Otolaryngology

## 2018-06-23 DIAGNOSIS — R42 Dizziness and giddiness: Secondary | ICD-10-CM | POA: Diagnosis not present

## 2018-06-23 DIAGNOSIS — H6123 Impacted cerumen, bilateral: Secondary | ICD-10-CM | POA: Diagnosis not present

## 2018-06-23 DIAGNOSIS — H903 Sensorineural hearing loss, bilateral: Secondary | ICD-10-CM

## 2018-09-02 ENCOUNTER — Ambulatory Visit: Payer: Medicare Other | Admitting: "Endocrinology

## 2018-10-14 ENCOUNTER — Telehealth: Payer: Self-pay

## 2018-10-14 DIAGNOSIS — E890 Postprocedural hypothyroidism: Secondary | ICD-10-CM

## 2018-10-14 LAB — TSH: TSH: 1.48 mIU/L (ref 0.40–4.50)

## 2018-10-14 LAB — T4, FREE: Free T4: 1.2 ng/dL (ref 0.8–1.8)

## 2018-10-14 NOTE — Telephone Encounter (Signed)
LeighAnn Arvo Ealy, CMA  

## 2018-10-21 ENCOUNTER — Ambulatory Visit (INDEPENDENT_AMBULATORY_CARE_PROVIDER_SITE_OTHER): Payer: Medicare Other | Admitting: "Endocrinology

## 2018-10-21 ENCOUNTER — Encounter: Payer: Self-pay | Admitting: "Endocrinology

## 2018-10-21 VITALS — BP 126/83 | HR 57 | Ht 63.0 in | Wt 252.0 lb

## 2018-10-21 DIAGNOSIS — E890 Postprocedural hypothyroidism: Secondary | ICD-10-CM

## 2018-10-21 DIAGNOSIS — E782 Mixed hyperlipidemia: Secondary | ICD-10-CM | POA: Diagnosis not present

## 2018-10-21 DIAGNOSIS — E119 Type 2 diabetes mellitus without complications: Secondary | ICD-10-CM | POA: Diagnosis not present

## 2018-10-21 DIAGNOSIS — I10 Essential (primary) hypertension: Secondary | ICD-10-CM | POA: Diagnosis not present

## 2018-10-21 NOTE — Patient Instructions (Signed)

## 2018-10-21 NOTE — Progress Notes (Signed)
Endocrinology follow-up note  Subjective:    Patient ID: Cheyenne Benson, female    DOB: May 25, 1946, PCP Rosita Fire, MD   Past Medical History:  Diagnosis Date  . Arthritis   . Asthma   . Diabetes mellitus without complication ([place])   . Dysrhythmia    "used to speed up a little-when my thyroid wa snot right, I'm on Metorpoolol no- no problem"  . Hypothyroidism    Past Surgical History:  Procedure Laterality Date  . [place] VITRECTOMY WITH 20 GAUGE MVR PORT FOR MACULAR HOLE Right 01/22/2013   Procedure: 25 GAUGE PARS PLANA VITRECTOMY WITH 20 GAUGE MVR PORT FOR MACULAR HOLE RIGHT EYE ;  Surgeon: Hayden Pedro, MD;  Location: [place];  Service: Ophthalmology;  Laterality: Right;  . ABDOMINAL HYSTERECTOMY  1998  . AXILLARY SURGERY Right    removes lump and sweet glands  . buninectom    . BUNIONECTOMY Bilateral   . COLONOSCOPY N/A 06/06/2016   Procedure: COLONOSCOPY;  Surgeon: Daneil Dolin, MD;  [facility];  Service: Endoscopy;  Laterality: N/A;  8:30 AM  . CYSTECTOMY     behind neck--- no anesthesia  . DG STYLOID PROCESS / TEMPORAL BONES    . EYE SURGERY Bilateral 4/20\14   cataract  . GAS/FLUID EXCHANGE Right 01/22/2013   Procedure: GAS/FLUID EXCHANGE;  Surgeon: Hayden Pedro, MD;  Location: [place];  Service: Ophthalmology;  Laterality: Right;  C3F8  . LASER PHOTO ABLATION Right 01/22/2013   Procedure: LASER PHOTO ABLATION;  Surgeon: Hayden Pedro, MD;  Location: [place];  Service: Ophthalmology;  Laterality: Right;  Headscope  . MEMBRANE PEEL Right 01/22/2013   Procedure: MEMBRANE PEEL;  Surgeon: Hayden Pedro, MD;  Location: [place];  Service: Ophthalmology;  Laterality: Right;  . SERUM PATCH Right 01/22/2013   Procedure: SERUM PATCH;  Surgeon: Hayden Pedro, MD;  Location: [place];  Service: Ophthalmology;  Laterality: Right;  . SKIN GRAFT Left    after axillary surgery  . TOE SURGERY     left bones removed from 4 and 5 toe,  Right foot 5th toe bone removed   Social History   Socioeconomic History  . Marital status: Married    Spouse name: Not on file  . Number of children: Not on file  . Years of education: Not on file  . Highest education level: Not on file  Occupational History  . Not on file  Social Needs  . Financial resource strain: Not on file  . Food insecurity:    Worry: Not on file    Inability: Not on file  . Transportation needs:    Medical: Not on file    Non-medical: Not on file  Tobacco Use  . Smoking status: Former Smoker    Years: 2.00  . Smokeless tobacco: Never Used  . Tobacco comment: maybe smoked a pack a month  Substance and Sexual Activity  . Alcohol use: No  . Drug use: No  . Sexual activity: Not on file  Lifestyle  . Physical activity:    Days per week: Not on file    Minutes per session: Not on file  . Stress: Not on file  Relationships  . Social connections:    Talks on phone: Not on file    Gets together: Not on file    Attends religious  service: Not on file    Active member of club or organization: Not on file    Attends meetings of clubs or organizations: Not on file    Relationship status: Not on file  Other Topics Concern  . Not on file  Social History Narrative  . Not on file   Outpatient Encounter Medications as of 10/21/2018  Medication Sig  . albuterol (PROAIR HFA) 108 (90 Base) MCG/ACT inhaler Inhale into the lungs daily as needed for wheezing or shortness of breath.  . ADVAIR DISKUS 250-50 MCG/DOSE AEPB Inhale 1 puff into the lungs daily.  Marland Kitchen aspirin EC 81 MG tablet Take 81 mg by mouth daily.  . hydrochlorothiazide (HYDRODIURIL) 25 MG tablet Take 25 mg by mouth daily.   Marland Kitchen levothyroxine (SYNTHROID, LEVOTHROID) 112 MCG tablet Take 1 tablet (112 mcg total) by mouth daily.  . metFORMIN (GLUCOPHAGE) 500 MG tablet Take 1 tablet (500 mg total) by mouth 2 (two) times daily with a meal. (Patient taking differently: Take 500 mg by mouth daily. )  .  metoprolol tartrate (LOPRESSOR) 50 MG tablet Take 50 mg by mouth daily.   . Multiple Vitamin (MULTIVITAMIN WITH MINERALS) TABS Take 1 tablet by mouth daily.   No facility-administered encounter medications on file as of 10/21/2018.    ALLERGIES: Allergies  Allergen Reactions  . Adhesive [Tape]     redness  . Codeine Nausea Only    Upset stomach   . Lisinopril   . Peanut-Containing Drug Products   . Shrimp [Shellfish Allergy]    VACCINATION STATUS:  There is no immunization history on file for this patient.  HPI  73--year-old female with medical history of  Graves' disease status post radioactive iodine ablation at approximately 73 years of age.     She is here for follow-up of  iodine induced hypothyroidism, type 2 DM, obesity, and hyperlipidemia.  She is currently on Levothyroxine 112 mcg po qam, and  metformin  500 mg by mouth once a day.  - She has been overweight/obese most of her adult life. She has observed that her weight is going up since last visit, partly due to holidays, winter, and less activity since last visit.   - She has no new complaints today. She denies cold/heat intolerance, palpitations, tremors, chest pain, shortness of breath. - She has family history of hypothyroidism in her mother.      Review of Systems   Constitutional: + Heavy weight  , no fatigue, no subjective hyperthermia/hypothermia Eyes:No blurry vision, no xerophthalmia. no dysphagia/odynophagia, no hoarseness  Musculoskeletal: no muscle/joint aches Skin: no rashes Neurological: No tremors, no numbness, no tingling, no dizziness.  Psychiatric: no depression/anxiety  Objective:    BP 126/83   Pulse (!) 57   Ht 5\' 3"  (1.6 m)   Wt 252 lb (114.3 kg)   BMI 44.64 kg/m   Wt Readings from Last 3 Encounters:  10/21/18 252 lb (114.3 kg)  02/27/18 236 lb (107 kg)  08/30/17 256 lb (116.1 kg)    Physical Exam   Constitutional:  +obese, not in acute distress.  Eyes: PERRLA, EOMI, no  exophthalmos ENT: Moist mucous membranes, no thyromegaly, no cervical lymphadenopathy.   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  She is advised to continue levothyroxine 112 mcg p.o. daily before breakfast.     - We discussed about the correct intake of her thyroid hormone, on empty stomach at fasting, with water, separated by at least 30 minutes from breakfast and other medications,  and separated by more than 4 hours from calcium, iron, multivitamins, acid reflux medications (PPIs). -Patient is made aware of the fact that thyroid hormone replacement is needed for life, dose to be adjusted by periodic monitoring of thyroid function tests.  2. Diabetes mellitus without complication ([place]) -Her labs show her recent A1c is 5.5%.  She is advised to continue metformin 500 mg p.o. daily after breakfast.   - Patient admits there is a room for improvement in her diet and drink choices. -  Suggestion is made for her to avoid simple carbohydrates  from her diet including Cakes, Sweet Desserts / Pastries, Ice Cream, Soda (diet and regular), Sweet Tea, Candies, Chips, Cookies, Store Bought Juices, Alcohol in Excess of  1-2 drinks a day, Artificial Sweeteners, and "Sugar-free" Products. This will help patient to have stable blood glucose profile and potentially avoid unintended weight gain.   3. Essential hypertension, benign - Her blood pressure is controlled to target.  She is advised to  continue her current blood pressure medications including  lisinopril-hydrochlorthiazide, and metoprolol.   4. Hyperlipidemia - From September 2018 her lipid profile was favorable including HDL of 87 and LDL of 73.  Has not tolerated Lipitor, will need fasting lipid panel before her next visit.     5. Morbid obesity due to excess calories [facility]): -  she returns with significant weight gain since last visit.  She is encouraged to continue with carbs restricted diet. - Bariatric surgery was briefly discussed with her however she is not interested at this time.   - Exercise involving long walks of 30-60 minutes 4 days a week advised.  - I advised patient to maintain close follow up with Rosita Fire, MD for primary care needs.   Follow up plan: Return in about 5 months (around 03/23/2019) for Follow up with Pre-visit Labs.  Glade Lloyd, MD Phone: [phone]  Fax: [phone]  This note was partially dictated with voice recognition software. Similar sounding words can be transcribed inadequately or may not  be corrected upon review.  10/21/2018, 5:35 PM

## 2018-12-13 ENCOUNTER — Other Ambulatory Visit: Payer: Self-pay | Admitting: "Endocrinology

## 2019-03-11 ENCOUNTER — Other Ambulatory Visit: Payer: Self-pay | Admitting: "Endocrinology

## 2019-03-17 LAB — COMPLETE METABOLIC PANEL WITH GFR
AG Ratio: 1.2 (calc) (ref 1.0–2.5)
ALT: 12 U/L (ref 6–29)
AST: 13 U/L (ref 10–35)
Albumin: 4 g/dL (ref 3.6–5.1)
Alkaline phosphatase (APISO): 62 U/L (ref 37–153)
BUN: 20 mg/dL (ref 7–25)
CO2: 29 mmol/L (ref 20–32)
Calcium: 9.9 mg/dL (ref 8.6–10.4)
Chloride: 101 mmol/L (ref 98–110)
Creat: 0.78 mg/dL (ref 0.60–0.93)
GFR, Est African American: 89 mL/min/{1.73_m2} (ref >=60)
GFR, Est Non African American: 76 mL/min/{1.73_m2} (ref >=60)
Globulin: 3.4 g/dL (calc) (ref 1.9–3.7)
Glucose, Bld: 111 mg/dL — ABNORMAL HIGH (ref 65–99)
Potassium: 3.9 mmol/L (ref 3.5–5.3)
Sodium: 141 mmol/L (ref 135–146)
Total Bilirubin: 0.4 mg/dL (ref 0.2–1.2)
Total Protein: 7.4 g/dL (ref 6.1–8.1)

## 2019-03-17 LAB — HEMOGLOBIN A1C
Hgb A1c MFr Bld: 5.7 % of total Hgb — ABNORMAL HIGH (ref <5.7)
Mean Plasma Glucose: 117 (calc)
eAG (mmol/L): 6.5 (calc)

## 2019-03-17 LAB — T4, FREE: Free T4: 1.5 ng/dL (ref 0.8–1.8)

## 2019-03-17 LAB — VITAMIN D 25 HYDROXY (VIT D DEFICIENCY, FRACTURES): Vit D, 25-Hydroxy: 43 ng/mL (ref 30–100)

## 2019-03-17 LAB — TSH: TSH: 1.26 mIU/L (ref 0.40–4.50)

## 2019-03-20 ENCOUNTER — Other Ambulatory Visit: Payer: Self-pay

## 2019-03-20 ENCOUNTER — Encounter: Payer: Self-pay | Admitting: "Endocrinology

## 2019-03-20 ENCOUNTER — Ambulatory Visit (INDEPENDENT_AMBULATORY_CARE_PROVIDER_SITE_OTHER): Payer: Medicare Other | Admitting: "Endocrinology

## 2019-03-20 DIAGNOSIS — E119 Type 2 diabetes mellitus without complications: Secondary | ICD-10-CM

## 2019-03-20 DIAGNOSIS — E890 Postprocedural hypothyroidism: Secondary | ICD-10-CM

## 2019-03-20 NOTE — Progress Notes (Signed)
03/20/2019                                                                       Endocrinology Telehealth Visit Follow up Note -During COVID -19 Pandemic  I connected with Cheyenne Benson on 03/20/2019   by telephone and verified that I am speaking with the correct person using two identifiers. Cheyenne Benson, 01/20/1946. she has verbally consented to this visit. All issues noted in this document were discussed and addressed. The format was not optimal for physical exam.    Subjective:    Patient ID: Cheyenne Benson, female    DOB: [birth year], PCP Rosita Fire, MD   Past Medical History:  Diagnosis Date  . Arthritis   . Asthma   . Diabetes mellitus without complication ([place])   . Dysrhythmia    "used to speed up a little-when my thyroid wa snot right, I'm on Metorpoolol no- no problem"  . Hypothyroidism    Past Surgical History:  Procedure Laterality Date  . [place] VITRECTOMY WITH 20 GAUGE MVR PORT FOR MACULAR HOLE Right 01/22/2013   Procedure: 25 GAUGE PARS PLANA VITRECTOMY WITH 20 GAUGE MVR PORT FOR MACULAR HOLE RIGHT EYE ;  Surgeon: Hayden Pedro, MD;  Location: [place];  Service: Ophthalmology;  Laterality: Right;  . ABDOMINAL HYSTERECTOMY  1998  . AXILLARY SURGERY Right    removes lump and sweet glands  . buninectom    . BUNIONECTOMY Bilateral   . COLONOSCOPY N/A 06/06/2016   Procedure: COLONOSCOPY;  Surgeon: Daneil Dolin, MD;  [facility];  Service: Endoscopy;  Laterality: N/A;  8:30 AM  . CYSTECTOMY     behind neck--- no anesthesia  . DG STYLOID PROCESS / TEMPORAL BONES    . EYE SURGERY Bilateral 4/20\14   cataract  . GAS/FLUID EXCHANGE Right 01/22/2013   Procedure: GAS/FLUID EXCHANGE;  Surgeon: Hayden Pedro, MD;  Location: [place];  Service: Ophthalmology;  Laterality: Right;  C3F8  . LASER PHOTO ABLATION Right 01/22/2013   Procedure: LASER PHOTO ABLATION;  Surgeon: Hayden Pedro, MD;  Location: [place];  Service: Ophthalmology;   Laterality: Right;  Headscope  . MEMBRANE PEEL Right 01/22/2013   Procedure: MEMBRANE PEEL;  Surgeon: Hayden Pedro, MD;  Location: [place];  Service: Ophthalmology;  Laterality: Right;  . SERUM PATCH Right 01/22/2013   Procedure: SERUM PATCH;  Surgeon: Hayden Pedro, MD;  Location: [place];  Service: Ophthalmology;  Laterality: Right;  . SKIN GRAFT Left    after axillary surgery  . TOE SURGERY     left bones removed from 4 and 5 toe, Right foot 5th toe bone removed   Social History   Socioeconomic History  . Marital status: Married    Spouse name: Not on file  . Number of children: Not on file  . Years of education: Not on file  . Highest education level: Not on file  Occupational History  . Not on file  Social Needs  . Financial resource strain: Not on file  . Food insecurity    Worry: Not on file    Inability:  Not on file  . Transportation needs    Medical: Not on file    Non-medical: Not on file  Tobacco Use  . Smoking status: Former Smoker    Years: 2.00  . Smokeless tobacco: Never Used  . Tobacco comment: maybe smoked a pack a month  Substance and Sexual Activity  . Alcohol use: No  . Drug use: No  . Sexual activity: Not on file  Lifestyle  . Physical activity    Days per week: Not on file    Minutes per session: Not on file  . Stress: Not on file  Relationships  . Social Herbalist on phone: Not on file    Gets together: Not on file    Attends religious service: Not on file    Active member of club or organization: Not on file    Attends meetings of clubs or organizations: Not on file    Relationship status: Not on file  Other Topics Concern  . Not on file  Social History Narrative  . Not on file   Outpatient Encounter Medications as of 03/20/2019  Medication Sig  . simvastatin (ZOCOR) 20 MG tablet Take 20 mg by mouth daily.  Marland Kitchen ADVAIR DISKUS 250-50 MCG/DOSE AEPB Inhale 1 puff into the lungs daily.  Marland Kitchen albuterol (PROAIR HFA) 108 (90 Base) MCG/ACT  inhaler Inhale into the lungs daily as needed for wheezing or shortness of breath.  Marland Kitchen aspirin EC 81 MG tablet Take 81 mg by mouth daily.  Arna Medici 112 MCG tablet Take 1 tablet by mouth once daily  . hydrochlorothiazide (HYDRODIURIL) 25 MG tablet Take 25 mg by mouth daily.   . metFORMIN (GLUCOPHAGE) 500 MG tablet Take 1 tablet (500 mg total) by mouth 2 (two) times daily with a meal. (Patient taking differently: Take 500 mg by mouth daily. )  . metoprolol tartrate (LOPRESSOR) 50 MG tablet Take 50 mg by mouth daily.   . Multiple Vitamin (MULTIVITAMIN WITH MINERALS) TABS Take 1 tablet by mouth daily.   No facility-administered encounter medications on file as of 03/20/2019.    ALLERGIES: Allergies  Allergen Reactions  . Adhesive [Tape]     redness  . Codeine Nausea Only    Upset stomach   . Lisinopril   . Peanut-Containing Drug Products   . Shrimp [Shellfish Allergy]    VACCINATION STATUS:  There is no immunization history on file for this patient.  HPI  73--year-old female with medical history of  Graves' disease status post radioactive iodine ablation at approximately 73 years of age.     She is here for follow-up of  iodine induced hypothyroidism, type 2 DM, obesity, and hyperlipidemia.  She is currently on levothyroxine 112 mcg p.o. daily before breakfast, and metformin 500 mg p.o. daily after breakfast.   - She has been overweight/obese most of her adult life. She reports that she is currently losing weight.   - She has no new complaints today. She denies cold/heat intolerance, palpitations, tremors, chest pain, shortness of breath. - She has family history of hypothyroidism in her mother.      Review of Systems   Limited as above.  Objective:    There were no vitals taken for this visit.  Wt Readings from Last 3 Encounters:  10/21/18 252 lb (114.3 kg)  02/27/18 236 lb (107 kg)  08/30/17 256 lb (116.1 kg)     Recent Results (from the past 2160 hour(s))  Hemoglobin  A1c  Status: Abnormal   Collection Time: 03/16/19  7:35 AM  Result Value Ref Range   Hgb A1c MFr Bld 5.7 (H) <5.7 % of total Hgb    Comment: For someone without known diabetes, a hemoglobin  A1c value between 5.7% and 6.4% is consistent with prediabetes and should be confirmed with a  follow-up test. . For someone with known diabetes, a value <7% indicates that their diabetes is well controlled. A1c targets should be individualized based on duration of diabetes, age, comorbid conditions, and other considerations. . This assay result is consistent with an increased risk of diabetes. . Currently, no consensus exists regarding use of hemoglobin A1c for diagnosis of diabetes for children. .    Mean Plasma Glucose 117 (calc)   eAG (mmol/L) 6.5 (calc)  COMPLETE METABOLIC PANEL WITH GFR     Status: Abnormal   Collection Time: 03/16/19  7:35 AM  Result Value Ref Range   Glucose, Bld 111 (H) 65 - 99 mg/dL    Comment: .            Fasting reference interval . For someone without known diabetes, a glucose value between 100 and 125 mg/dL is consistent with prediabetes and should be confirmed with a follow-up test. .    BUN 20 7 - 25 mg/dL   Creat 0.78 0.60 - 0.93 mg/dL    Comment: For patients >52 years of age, the reference limit for Creatinine is approximately 13% higher for people identified as African-American. .    GFR, Est Non African American 76 > OR = 60 mL/min/1.53m2   GFR, Est African American 89 > OR = 60 mL/min/1.42m2   BUN/Creatinine Ratio NOT APPLICABLE 6 - 22 (calc)   Sodium 141 135 - 146 mmol/L   Potassium 3.9 3.5 - 5.3 mmol/L   Chloride 101 98 - 110 mmol/L   CO2 29 20 - 32 mmol/L   Calcium 9.9 8.6 - 10.4 mg/dL   Total Protein 7.4 6.1 - 8.1 g/dL   Albumin 4.0 3.6 - 5.1 g/dL   Globulin 3.4 1.9 - 3.7 g/dL (calc)   AG Ratio 1.2 1.0 - 2.5 (calc)   Total Bilirubin 0.4 0.2 - 1.2 mg/dL   Alkaline phosphatase (APISO) 62 37 - 153 U/L   AST 13 10 - 35 U/L   ALT  12 6 - 29 U/L  TSH     Status: None   Collection Time: 03/16/19  7:35 AM  Result Value Ref Range   TSH 1.26 0.40 - 4.50 mIU/L  T4, free     Status: None   Collection Time: 03/16/19  7:35 AM  Result Value Ref Range   Free T4 1.5 0.8 - 1.8 ng/dL  VITAMIN D 25 Hydroxy (Vit-D Deficiency, Fractures)     Status: None   Collection Time: 03/16/19  7:35 AM  Result Value Ref Range   Vit D, 25-Hydroxy 43 30 - 100 ng/mL    Comment: Vitamin D Status         25-OH Vitamin D: . Deficiency:                    <20 ng/mL Insufficiency:             20 - 29 ng/mL Optimal:                 > or = 30 ng/mL . For 25-OH Vitamin D testing on patients on  D2-supplementation and patients for whom quantitation  of D2  and D3 fractions is required, the [facility](TM) 25-OH VIT D, (D2,D3), LC/MS/MS is recommended: order  code [id number] (patients >68yrs). See Note 1 . Note 1 . 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  She is advised to continue levothyroxine 112 mcg p.o. daily before breakfast.    - We discussed about the correct intake of her thyroid hormone, on empty stomach at fasting, with water, separated by at least 30 minutes from breakfast and other medications,  and separated by more than 4 hours from calcium, iron, multivitamins, acid reflux medications (PPIs). -Patient is made aware of the fact that thyroid hormone replacement is needed for life, dose to be adjusted by periodic monitoring of thyroid function tests.   2. Diabetes mellitus without complication ([place]) -Her labs show her recent A1c is 5.7%.  She is responding and advised to continue metformin 500 mg p.o. daily after breakfast.   - she  admits there is a room for  improvement in her diet and drink choices. -  Suggestion is made for her to avoid simple carbohydrates  from her diet including Cakes, Sweet Desserts / Pastries, Ice Cream, Soda (diet and regular), Sweet Tea, Candies, Chips, Cookies, Sweet Pastries,  Store Bought Juices, Alcohol in Excess of  1-2 drinks a day, Artificial Sweeteners, Coffee Creamer, and "Sugar-free" Products. This will help patient to have stable blood glucose profile and potentially avoid unintended weight gain.    3. Essential hypertension, benign she is advised to home monitor blood pressure and report if > 140/90 on 2 separate readings. She is advised to continue her current blood pressure medications including  lisinopril-hydrochlorthiazide, and metoprolol.   4. Hyperlipidemia - From September 2018 her lipid profile was favorable including HDL of 87 and LDL of 73.  She was recently initiated on Zocor 20 mg p.o. nightly, advised to continue.    - I advised patient to maintain close follow up with Rosita Fire, MD for primary care needs.   Time for this visit: 15 minutes. Cheyenne Benson  participated in the discussions, expressed understanding, and voiced agreement with the above plans.  All questions were answered to her satisfaction. she is encouraged to contact clinic should she have any questions or concerns prior to her return visit.  Follow up plan: Return in about 6 months (around 09/20/2019) for Follow up with Pre-visit Labs.  Glade Lloyd, MD Phone: [phone]  Fax: [phone]  This note was partially dictated with voice recognition software. Similar sounding words can be transcribed inadequately or may not  be corrected upon review.  03/20/2019, 12:21 PM

## 2019-03-23 ENCOUNTER — Ambulatory Visit: Payer: Medicare Other | Admitting: "Endocrinology

## 2019-05-20 ENCOUNTER — Other Ambulatory Visit (HOSPITAL_COMMUNITY): Payer: Self-pay | Admitting: Internal Medicine

## 2019-05-20 DIAGNOSIS — Z1231 Encounter for screening mammogram for malignant neoplasm of breast: Secondary | ICD-10-CM

## 2019-06-04 ENCOUNTER — Other Ambulatory Visit: Payer: Self-pay | Admitting: "Endocrinology

## 2019-06-29 ENCOUNTER — Ambulatory Visit (HOSPITAL_COMMUNITY)
Admission: RE | Admit: 2019-06-29 | Discharge: 2019-06-29 | Disposition: A | Discharge status: Home / Self Care | Payer: Medicare Other | Source: Ambulatory Visit | Attending: Internal Medicine | Admitting: Internal Medicine

## 2019-06-29 ENCOUNTER — Other Ambulatory Visit: Payer: Self-pay

## 2019-06-29 DIAGNOSIS — Z1231 Encounter for screening mammogram for malignant neoplasm of breast: Secondary | ICD-10-CM | POA: Insufficient documentation

## 2019-08-27 ENCOUNTER — Other Ambulatory Visit: Payer: Self-pay | Admitting: "Endocrinology

## 2019-08-29 DIAGNOSIS — E039 Hypothyroidism, unspecified: Secondary | ICD-10-CM | POA: Diagnosis not present

## 2019-08-29 DIAGNOSIS — I10 Essential (primary) hypertension: Secondary | ICD-10-CM | POA: Diagnosis not present

## 2019-09-24 ENCOUNTER — Other Ambulatory Visit: Payer: Self-pay

## 2019-09-24 ENCOUNTER — Ambulatory Visit: Payer: Medicare Other | Admitting: "Endocrinology

## 2019-09-24 ENCOUNTER — Ambulatory Visit: Payer: Medicare PPO | Attending: Internal Medicine

## 2019-09-24 DIAGNOSIS — Z23 Encounter for immunization: Secondary | ICD-10-CM | POA: Insufficient documentation

## 2019-09-24 NOTE — Progress Notes (Signed)
   Covid-19 Vaccination Clinic  Name:  Cheyenne Benson    MRN: [id number] DOB: 01-11-1946  09/24/2019  [name] was observed post Covid-19 immunization for 15 minutes without incidence. She was provided with Vaccine Information Sheet and instruction to access the V-Safe system.   [name] was instructed to call 911 with any severe reactions post vaccine: Marland Kitchen Difficulty breathing  . Swelling of your face and throat  . A fast heartbeat  . A bad rash all over your body  . Dizziness and weakness    Immunizations Administered    Name Date Dose VIS Date Route   Moderna COVID-19 Vaccine 09/24/2019  8:50 AM 0.5 mL 07/14/2019 Intramuscular   Manufacturer: Moderna   Lot: [id number]   [id number]

## 2019-09-29 DIAGNOSIS — E119 Type 2 diabetes mellitus without complications: Secondary | ICD-10-CM | POA: Diagnosis not present

## 2019-09-29 DIAGNOSIS — I10 Essential (primary) hypertension: Secondary | ICD-10-CM | POA: Diagnosis not present

## 2019-10-26 ENCOUNTER — Ambulatory Visit: Payer: Medicare PPO | Attending: Internal Medicine

## 2019-10-26 DIAGNOSIS — Z23 Encounter for immunization: Secondary | ICD-10-CM

## 2019-10-26 NOTE — Progress Notes (Signed)
   Covid-19 Vaccination Clinic  Name:  Cheyenne Benson    MRN: [id number] DOB: 1946/06/11  10/26/2019  [name] was observed post Covid-19 immunization for 30 minutes based on pre-vaccination screening without incident. She was provided with Vaccine Information Sheet and instruction to access the V-Safe system.    [name] was instructed to call 911 with any severe reactions post vaccine: Marland Kitchen Difficulty breathing  . Swelling of face and throat  . A fast heartbeat  . A bad rash all over body  . Dizziness and weakness   Immunizations Administered    Name Date Dose VIS Date Route   Moderna COVID-19 Vaccine 10/26/2019  9:09 AM 0.5 mL 07/14/2019 Intramuscular   Manufacturer: Moderna   Lot: [id number]   [id number]

## 2019-10-27 DIAGNOSIS — I10 Essential (primary) hypertension: Secondary | ICD-10-CM | POA: Diagnosis not present

## 2019-10-27 DIAGNOSIS — E119 Type 2 diabetes mellitus without complications: Secondary | ICD-10-CM | POA: Diagnosis not present

## 2019-11-11 ENCOUNTER — Telehealth: Payer: Self-pay

## 2019-11-11 DIAGNOSIS — E782 Mixed hyperlipidemia: Secondary | ICD-10-CM

## 2019-11-11 DIAGNOSIS — E890 Postprocedural hypothyroidism: Secondary | ICD-10-CM

## 2019-11-11 NOTE — Telephone Encounter (Signed)
Please send update lab to [facility]

## 2019-11-11 NOTE — Telephone Encounter (Signed)
Lab orders updated and sent to [facility]. 

## 2019-11-17 DIAGNOSIS — E890 Postprocedural hypothyroidism: Secondary | ICD-10-CM | POA: Diagnosis not present

## 2019-11-17 DIAGNOSIS — E782 Mixed hyperlipidemia: Secondary | ICD-10-CM | POA: Diagnosis not present

## 2019-11-18 LAB — LIPID PANEL
Cholesterol: 185 mg/dL (ref <200)
HDL: 79 mg/dL (ref >=50)
LDL Cholesterol (Calc): 89 mg/dL (calc)
Non-HDL Cholesterol (Calc): 106 mg/dL (calc) (ref <130)
Total CHOL/HDL Ratio: 2.3 (calc) (ref <5.0)
Triglycerides: 76 mg/dL (ref <150)

## 2019-11-18 LAB — ADVANCED WRITTEN NOTIFICATION (AWN) TEST REFUSAL: AWN TEST REFUSED: 17306

## 2019-11-18 LAB — TSH: TSH: 2.65 mIU/L (ref 0.40–4.50)

## 2019-11-18 LAB — T4, FREE: Free T4: 1.2 ng/dL (ref 0.8–1.8)

## 2019-11-24 ENCOUNTER — Other Ambulatory Visit: Payer: Self-pay

## 2019-11-24 ENCOUNTER — Ambulatory Visit (INDEPENDENT_AMBULATORY_CARE_PROVIDER_SITE_OTHER): Payer: Medicare PPO | Admitting: "Endocrinology

## 2019-11-24 ENCOUNTER — Encounter: Payer: Self-pay | Admitting: "Endocrinology

## 2019-11-24 VITALS — BP 150/93 | HR 51 | Ht 65.0 in | Wt 252.4 lb

## 2019-11-24 DIAGNOSIS — I10 Essential (primary) hypertension: Secondary | ICD-10-CM | POA: Diagnosis not present

## 2019-11-24 DIAGNOSIS — E039 Hypothyroidism, unspecified: Secondary | ICD-10-CM | POA: Diagnosis not present

## 2019-11-24 DIAGNOSIS — E119 Type 2 diabetes mellitus without complications: Secondary | ICD-10-CM | POA: Diagnosis not present

## 2019-11-24 DIAGNOSIS — E890 Postprocedural hypothyroidism: Secondary | ICD-10-CM | POA: Diagnosis not present

## 2019-11-24 DIAGNOSIS — E782 Mixed hyperlipidemia: Secondary | ICD-10-CM

## 2019-11-24 LAB — POCT GLYCOSYLATED HEMOGLOBIN (HGB A1C): Hemoglobin A1C: 5.8 % — AB (ref 4.0–5.6)

## 2019-11-24 MED ORDER — LEVOTHYROXINE SODIUM 112 MCG PO TABS: 112.0000 ug | ORAL_TABLET | Freq: Every day | ORAL | 1 refills | Status: DC

## 2019-11-24 MED ORDER — SIMVASTATIN 40 MG PO TABS: 40.0000 mg | ORAL_TABLET | Freq: Every day | ORAL | 1 refills | Status: DC

## 2019-11-24 NOTE — Progress Notes (Signed)
11/24/2019                                                                       Endocrinology Telehealth Visit Follow up Note -During COVID -19 Pandemic  I connected with Cheyenne Benson on 11/24/2019   by telephone and verified that I am speaking with the correct person using two identifiers. Cheyenne Benson, [birth year]. she has verbally consented to this visit. All issues noted in this document were discussed and addressed. The format was not optimal for physical exam.    Subjective:    Patient ID: Cheyenne Benson, female    DOB: [birth year], PCP Rosita Fire, MD   Past Medical History:  Diagnosis Date  . Arthritis   . Asthma   . Diabetes mellitus without complication ([place])   . Dysrhythmia    "used to speed up a little-when my thyroid wa snot right, I'm on Metorpoolol no- no problem"  . Hypothyroidism    Past Surgical History:  Procedure Laterality Date  . [place] VITRECTOMY WITH 20 GAUGE MVR PORT FOR MACULAR HOLE Right 01/22/2013   Procedure: 25 GAUGE PARS PLANA VITRECTOMY WITH 20 GAUGE MVR PORT FOR MACULAR HOLE RIGHT EYE ;  Surgeon: Hayden Pedro, MD;  Location: [place];  Service: Ophthalmology;  Laterality: Right;  . ABDOMINAL HYSTERECTOMY  1998  . AXILLARY SURGERY Right    removes lump and sweet glands  . buninectom    . BUNIONECTOMY Bilateral   . COLONOSCOPY N/A 06/06/2016   Procedure: COLONOSCOPY;  Surgeon: Daneil Dolin, MD;  [facility];  Service: Endoscopy;  Laterality: N/A;  8:30 AM  . CYSTECTOMY     behind neck--- no anesthesia  . DG STYLOID PROCESS / TEMPORAL BONES    . EYE SURGERY Bilateral 4/20\14   cataract  . GAS/FLUID EXCHANGE Right 01/22/2013   Procedure: GAS/FLUID EXCHANGE;  Surgeon: Hayden Pedro, MD;  Location: [place];  Service: Ophthalmology;  Laterality: Right;  C3F8  . LASER PHOTO ABLATION Right 01/22/2013   Procedure: LASER PHOTO ABLATION;  Surgeon: Hayden Pedro, MD;  Location: [place];  Service: Ophthalmology;   Laterality: Right;  Headscope  . MEMBRANE PEEL Right 01/22/2013   Procedure: MEMBRANE PEEL;  Surgeon: Hayden Pedro, MD;  Location: [place];  Service: Ophthalmology;  Laterality: Right;  . SERUM PATCH Right 01/22/2013   Procedure: SERUM PATCH;  Surgeon: Hayden Pedro, MD;  Location: [place];  Service: Ophthalmology;  Laterality: Right;  . SKIN GRAFT Left    after axillary surgery  . TOE SURGERY     left bones removed from 4 and 5 toe, Right foot 5th toe bone removed   Social History   Socioeconomic History  . Marital status: Married    Spouse name: Not on file  . Number of children: Not on file  . Years of education: Not on file  . Highest education level: Not on file  Occupational History  . Not on file  Tobacco Use  . Smoking status: Former Smoker    Years: 2.00  . Smokeless tobacco: Never Used  . Tobacco comment: maybe smoked  a pack a month  Substance and Sexual Activity  . Alcohol use: No  . Drug use: No  . Sexual activity: Not on file  Other Topics Concern  . Not on file  Social History Narrative  . Not on file   Social Determinants of Health   Financial Resource Strain:   . Difficulty of Paying Living Expenses:   Food Insecurity:   . Worried About Charity fundraiser in the Last Year:   . Arboriculturist in the Last Year:   Transportation Needs:   . Film/video editor (Medical):   Marland Kitchen Lack of Transportation (Non-Medical):   Physical Activity:   . Days of Exercise per Week:   . Minutes of Exercise per Session:   Stress:   . Feeling of Stress :   Social Connections:   . Frequency of Communication with Friends and Family:   . Frequency of Social Gatherings with Friends and Family:   . Attends Religious Services:   . Active Member of Clubs or Organizations:   . Attends Archivist Meetings:   Marland Kitchen Marital Status:    Outpatient Encounter Medications as of 11/24/2019  Medication Sig  . Cholecalciferol (VITAMIN D3) 50 MCG (2000 UT) TABS Take 1 tablet by  mouth daily.  . COD LIVER OIL PO Take 650 mg by mouth daily.  . Flaxseed, Linseed, (FLAX SEED OIL PO) Take 1 tablet by mouth daily.  . TURMERIC CURCUMIN PO Take 1 tablet by mouth daily.  Marland Kitchen ADVAIR DISKUS 250-50 MCG/DOSE AEPB Inhale 1 puff into the lungs daily.  Marland Kitchen albuterol (PROAIR HFA) 108 (90 Base) MCG/ACT inhaler Inhale into the lungs daily as needed for wheezing or shortness of breath.  Marland Kitchen aspirin EC 81 MG tablet Take 81 mg by mouth daily.  . hydrALAZINE (APRESOLINE) 25 MG tablet 1 tablet 2 (two) times daily.  . hydrochlorothiazide (HYDRODIURIL) 25 MG tablet Take 25 mg by mouth daily.   Marland Kitchen levothyroxine (EUTHYROX) 112 MCG tablet Take 1 tablet (112 mcg total) by mouth daily.  . metFORMIN (GLUCOPHAGE) 500 MG tablet Take 1 tablet (500 mg total) by mouth 2 (two) times daily with a meal. (Patient taking differently: Take 500 mg by mouth daily. )  . metoprolol tartrate (LOPRESSOR) 50 MG tablet Take 50 mg by mouth daily.   . Multiple Vitamin (MULTIVITAMIN WITH MINERALS) TABS Take 1 tablet by mouth daily.  . simvastatin (ZOCOR) 40 MG tablet Take 1 tablet (40 mg total) by mouth daily.  . [DISCONTINUED] EUTHYROX 112 MCG tablet Take 1 tablet by mouth once daily  . [DISCONTINUED] simvastatin (ZOCOR) 20 MG tablet Take 20 mg by mouth daily.   No facility-administered encounter medications on file as of 11/24/2019.   ALLERGIES: Allergies  Allergen Reactions  . Adhesive [Tape]     redness  . Codeine Nausea Only    Upset stomach   . Lisinopril   . Peanut-Containing Drug Products   . Shrimp [Shellfish Allergy]    VACCINATION STATUS: Immunization History  Administered Date(s) Administered  . Influenza-Unspecified 04/13/2018  . Moderna SARS-COVID-2 Vaccination 09/24/2019, 10/26/2019    HPI  74--year-old female with medical history of  Graves' disease status post radioactive iodine ablation at approximately 74 years of age.     She is here to follow-up for hypothyroidism, type 2 diabetes,  hyperlipidemia.  She is currently on levothyroxine 112 mcg p.o. daily before breakfast, Metformin 500 mg p.o. daily after breakfast     - She has been overweight/obese  most of her adult life.  She has steady weight since last visit. - She has no new complaints today. She denies cold/heat intolerance, palpitations, tremors, chest pain, shortness of breath. - She has family history of hypothyroidism in her mother.      Review of Systems   Limited as above.  Objective:    BP (!) 150/93   Pulse (!) 51   Ht 5\' 5"  (1.651 m)   Wt 252 lb 6.4 oz (114.5 kg)   BMI 42.00 kg/m   Wt Readings from Last 3 Encounters:  11/24/19 252 lb 6.4 oz (114.5 kg)  10/21/18 252 lb (114.3 kg)  02/27/18 236 lb (107 kg)    Physical Exam- Limited  Constitutional:  Body mass index is 42 kg/m. , not in acute distress, normal state of mind Eyes:  EOMI, no exophthalmos Neck: Supple Thyroid: No gross goiter Respiratory: Adequate breathing efforts Musculoskeletal: no gross deformities, strength intact in all four extremities, no gross restriction of joint movements Skin:  no rashes, no hyperemia Neurological: no tremor with outstretched hands,    Recent Results (from the past 2160 hour(s))  Lipid Panel     Status: None   Collection Time: 11/17/19  7:38 AM  Result Value Ref Range   Cholesterol 185 <200 mg/dL   HDL 79 > OR = 50 mg/dL   Triglycerides 76 <150 mg/dL   LDL Cholesterol (Calc) 89 mg/dL (calc)    Comment: Reference range: <100 . Desirable range <100 mg/dL for primary prevention;   <70 mg/dL for patients with CHD or diabetic patients  with > or = 2 CHD risk factors. Marland Kitchen LDL-C is now calculated using the Martin-Hopkins  calculation, which is a validated novel method providing  better accuracy than the Friedewald equation in the  estimation of LDL-C.  Cresenciano Genre et al. Annamaria Helling. [id number]): 2061-2068  (http://education.[employer].com/faq/FAQ164)    Total CHOL/HDL Ratio 2.3 <5.0 (calc)    Non-HDL Cholesterol (Calc) 106 <130 mg/dL (calc)    Comment: For patients with diabetes plus 1 major ASCVD risk  factor, treating to a non-HDL-C goal of <100 mg/dL  (LDL-C of <70 mg/dL) is considered a therapeutic  option.   TSH     Status: None   Collection Time: 11/17/19  7:38 AM  Result Value Ref Range   TSH 2.65 0.40 - 4.50 mIU/L  T4, free     Status: None   Collection Time: 11/17/19  7:38 AM  Result Value Ref Range   Free T4 1.2 0.8 - 1.8 ng/dL  Advanced Written Notification [facility]) Test Refusal     Status: None   Collection Time: 11/17/19  7:38 AM  Result Value Ref Range   RAM1      Comment: . Be advised that your patient has indicated on the advance written notice their decision not to receive the following laboratory tests. As a result, the tests will not be performed.    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    - She is advised to continue  levothyroxine 112 mcg p.o. daily before breakfast.    - We discussed about the correct intake of her thyroid hormone, on empty stomach at fasting, with water, separated by at least 30 minutes from breakfast and other medications,  and separated by more than 4 hours from calcium, iron, multivitamins, acid reflux medications (PPIs). -Patient is made aware of the fact that thyroid hormone replacement is needed for life, dose to be adjusted by periodic monitoring of thyroid function tests.   2. Diabetes mellitus without complication ([place]) -Her labs show her recent A1c is 5.7%.  She is responding and advised to continue metformin 500 mg p.o. daily after breakfast.   -  she  admits there is a room for improvement in her diet and drink choices. -  Suggestion is made for her to avoid simple carbohydrates  from her diet including Cakes, Sweet Desserts / Pastries, Ice Cream, Soda (diet and regular), Sweet Tea, Candies, Chips, Cookies, Sweet Pastries,  Store Bought Juices, Alcohol in Excess of  1-2 drinks a day, Artificial Sweeteners, Coffee Creamer, and "Sugar-free" Products. This will help patient to have stable blood glucose profile and potentially avoid unintended weight gain.   3. Essential hypertension, benign Her blood pressure is not controlled to target. She is advised to continue her current blood pressure medications including  lisinopril-hydrochlorthiazide, and metoprolol.  Hydralazine 25 mg p.o. twice daily was recently added by her PMD.  4. Hyperlipidemia - From September 2018 her lipid profile was favorable including HDL of 87 and LDL of 73.  She was recently initiated on Zocor 20 mg p.o. nightly, advised to continue.     - I advised patient to maintain close follow up with Rosita Fire, MD for primary care needs.      - Time spent on this patient care encounter:  25 minutes of which 50% was spent in  counseling and the rest reviewing  her current and  previous labs / studies and medications  doses and developing a plan for long term care. Cheyenne Benson  participated in the discussions, expressed understanding, and voiced agreement with the above plans.  All questions were answered to her satisfaction. she is encouraged to contact clinic should she have any questions or concerns prior to her return visit.  Follow up plan: Return in about 6 months (around 05/25/2020) for Follow up with Pre-visit Labs.  Glade Lloyd, MD Phone: [phone]  Fax: [phone]  This note was partially dictated with voice recognition software. Similar sounding words can be transcribed inadequately or may not  be corrected upon review.  11/24/2019, 6:48 PM

## 2019-11-24 NOTE — Patient Instructions (Signed)

## 2019-12-25 DIAGNOSIS — E119 Type 2 diabetes mellitus without complications: Secondary | ICD-10-CM | POA: Diagnosis not present

## 2019-12-25 DIAGNOSIS — I10 Essential (primary) hypertension: Secondary | ICD-10-CM | POA: Diagnosis not present

## 2020-01-25 DIAGNOSIS — E785 Hyperlipidemia, unspecified: Secondary | ICD-10-CM | POA: Diagnosis not present

## 2020-01-25 DIAGNOSIS — E039 Hypothyroidism, unspecified: Secondary | ICD-10-CM | POA: Diagnosis not present

## 2020-02-24 DIAGNOSIS — E785 Hyperlipidemia, unspecified: Secondary | ICD-10-CM | POA: Diagnosis not present

## 2020-02-24 DIAGNOSIS — E119 Type 2 diabetes mellitus without complications: Secondary | ICD-10-CM | POA: Diagnosis not present

## 2020-03-21 ENCOUNTER — Other Ambulatory Visit: Payer: Self-pay

## 2020-03-21 DIAGNOSIS — E890 Postprocedural hypothyroidism: Secondary | ICD-10-CM

## 2020-03-21 MED ORDER — LEVOTHYROXINE SODIUM 112 MCG PO TABS: 112.0000 ug | ORAL_TABLET | Freq: Every day | ORAL | 1 refills | Status: DC

## 2020-03-26 DIAGNOSIS — E039 Hypothyroidism, unspecified: Secondary | ICD-10-CM | POA: Diagnosis not present

## 2020-03-26 DIAGNOSIS — E785 Hyperlipidemia, unspecified: Secondary | ICD-10-CM | POA: Diagnosis not present

## 2020-04-15 ENCOUNTER — Telehealth: Payer: Self-pay | Admitting: "Endocrinology

## 2020-04-15 NOTE — Telephone Encounter (Signed)
Discussed with pt, understanding voiced. 

## 2020-04-15 NOTE — Telephone Encounter (Signed)
She does not have to test the records.

## 2020-04-15 NOTE — Telephone Encounter (Signed)
How often do you want pt testing her BG? She states she has not had to test before.

## 2020-04-15 NOTE — Telephone Encounter (Signed)
Pt requesting test strips for her freestyle lite. [facility] in [place]

## 2020-04-26 DIAGNOSIS — I10 Essential (primary) hypertension: Secondary | ICD-10-CM | POA: Diagnosis not present

## 2020-04-26 DIAGNOSIS — E785 Hyperlipidemia, unspecified: Secondary | ICD-10-CM | POA: Diagnosis not present

## 2020-05-19 DIAGNOSIS — E890 Postprocedural hypothyroidism: Secondary | ICD-10-CM | POA: Diagnosis not present

## 2020-05-19 LAB — TSH: TSH: 1.71 mIU/L (ref 0.40–4.50)

## 2020-05-19 LAB — T4, FREE: Free T4: 1.4 ng/dL (ref 0.8–1.8)

## 2020-05-26 ENCOUNTER — Telehealth (INDEPENDENT_AMBULATORY_CARE_PROVIDER_SITE_OTHER): Payer: Medicare PPO | Admitting: "Endocrinology

## 2020-05-26 ENCOUNTER — Encounter: Payer: Self-pay | Admitting: "Endocrinology

## 2020-05-26 VITALS — Ht 65.0 in

## 2020-05-26 DIAGNOSIS — L659 Nonscarring hair loss, unspecified: Secondary | ICD-10-CM | POA: Diagnosis not present

## 2020-05-26 DIAGNOSIS — E890 Postprocedural hypothyroidism: Secondary | ICD-10-CM | POA: Diagnosis not present

## 2020-05-26 DIAGNOSIS — E119 Type 2 diabetes mellitus without complications: Secondary | ICD-10-CM | POA: Diagnosis not present

## 2020-05-26 DIAGNOSIS — R42 Dizziness and giddiness: Secondary | ICD-10-CM | POA: Diagnosis not present

## 2020-05-26 DIAGNOSIS — I10 Essential (primary) hypertension: Secondary | ICD-10-CM

## 2020-05-26 DIAGNOSIS — Z23 Encounter for immunization: Secondary | ICD-10-CM | POA: Diagnosis not present

## 2020-05-26 MED ORDER — LEVOTHYROXINE SODIUM 112 MCG PO TABS: 112.0000 ug | ORAL_TABLET | Freq: Every day | ORAL | 1 refills | Status: DC

## 2020-05-26 NOTE — Progress Notes (Signed)
05/26/2020                                             Endocrinology Telehealth Visit Follow up Note -During COVID -19 Pandemic  This visit type was conducted  via telephone due to national recommendations for restrictions regarding the COVID-19 Pandemic  in an effort to limit this patient's exposure and mitigate transmission of the corona virus.   I connected with Cheyenne Benson on 05/26/2020   by telephone and verified that I am speaking with the correct person using two identifiers. Cheyenne Benson, 07/07/1946. she has verbally consented to this visit.  I was in my office and patient was in her residence. No other persons were with me during the encounter. All issues noted in this document were discussed and addressed. The format was not optimal for physical exam.   Subjective:    Patient ID: Cheyenne Benson, female    DOB: [birth year], PCP Rosita Fire, MD   Past Medical History:  Diagnosis Date  . Arthritis   . Asthma   . Diabetes mellitus without complication ([place])   . Dysrhythmia    "used to speed up a little-when my thyroid wa snot right, I'm on Metorpoolol no- no problem"  . Hypothyroidism    Past Surgical History:  Procedure Laterality Date  . [place] VITRECTOMY WITH 20 GAUGE MVR PORT FOR MACULAR HOLE Right 01/22/2013   Procedure: 25 GAUGE PARS PLANA VITRECTOMY WITH 20 GAUGE MVR PORT FOR MACULAR HOLE RIGHT EYE ;  Surgeon: Hayden Pedro, MD;  Location: [place];  Service: Ophthalmology;  Laterality: Right;  . ABDOMINAL HYSTERECTOMY  1998  . AXILLARY SURGERY Right    removes lump and sweet glands  . buninectom    . BUNIONECTOMY Bilateral   . COLONOSCOPY N/A 06/06/2016   Procedure: COLONOSCOPY;  Surgeon: Daneil Dolin, MD;  [facility];  Service: Endoscopy;  Laterality: N/A;  8:30 AM  . CYSTECTOMY     behind neck--- no anesthesia  . DG STYLOID PROCESS / TEMPORAL BONES    . EYE SURGERY Bilateral 4/20\14   cataract  . GAS/FLUID  EXCHANGE Right 01/22/2013   Procedure: GAS/FLUID EXCHANGE;  Surgeon: Hayden Pedro, MD;  Location: [place];  Service: Ophthalmology;  Laterality: Right;  C3F8  . LASER PHOTO ABLATION Right 01/22/2013   Procedure: LASER PHOTO ABLATION;  Surgeon: Hayden Pedro, MD;  Location: [place];  Service: Ophthalmology;  Laterality: Right;  Headscope  . MEMBRANE PEEL Right 01/22/2013   Procedure: MEMBRANE PEEL;  Surgeon: Hayden Pedro, MD;  Location: [place];  Service: Ophthalmology;  Laterality: Right;  . SERUM PATCH Right 01/22/2013   Procedure: SERUM PATCH;  Surgeon: Hayden Pedro, MD;  Location: [place];  Service: Ophthalmology;  Laterality: Right;  . SKIN GRAFT Left    after axillary surgery  . TOE SURGERY     left bones removed from 4 and 5 toe, Right foot 5th toe bone removed   Social History   Socioeconomic History  . Marital status: Married    Spouse name: Not on file  . Number of children: Not on file  . Years of education: Not on file  . Highest education level: Not on file  Occupational History  .  Not on file  Tobacco Use  . Smoking status: Former Smoker    Years: 2.00  . Smokeless tobacco: Never Used  . Tobacco comment: maybe smoked a pack a month  Vaping Use  . Vaping Use: Never used  Substance and Sexual Activity  . Alcohol use: No  . Drug use: No  . Sexual activity: Not on file  Other Topics Concern  . Not on file  Social History Narrative  . Not on file   Social Determinants of Health   Financial Resource Strain:   . Difficulty of Paying Living Expenses: Not on file  Food Insecurity:   . Worried About Charity fundraiser in the Last Year: Not on file  . Ran Out of Food in the Last Year: Not on file  Transportation Needs:   . Lack of Transportation (Medical): Not on file  . Lack of Transportation (Non-Medical): Not on file  Physical Activity:   . Days of Exercise per Week: Not on file  . Minutes of Exercise per Session: Not on file  Stress:   . Feeling of Stress :  Not on file  Social Connections:   . Frequency of Communication with Friends and Family: Not on file  . Frequency of Social Gatherings with Friends and Family: Not on file  . Attends Religious Services: Not on file  . Active Member of Clubs or Organizations: Not on file  . Attends Archivist Meetings: Not on file  . Marital Status: Not on file   Outpatient Encounter Medications as of 05/26/2020  Medication Sig  . ADVAIR DISKUS 250-50 MCG/DOSE AEPB Inhale 1 puff into the lungs daily.  Marland Kitchen albuterol (PROAIR HFA) 108 (90 Base) MCG/ACT inhaler Inhale into the lungs daily as needed for wheezing or shortness of breath.  Marland Kitchen aspirin EC 81 MG tablet Take 81 mg by mouth daily.  . Cholecalciferol (VITAMIN D3) 50 MCG (2000 UT) TABS Take 1 tablet by mouth daily.  . COD LIVER OIL PO Take 650 mg by mouth daily.  . Flaxseed, Linseed, (FLAX SEED OIL PO) Take 1 tablet by mouth daily.  . hydrALAZINE (APRESOLINE) 50 MG tablet Take 1 tablet by mouth daily.  . hydrochlorothiazide (HYDRODIURIL) 25 MG tablet Take 25 mg by mouth daily.   Marland Kitchen levothyroxine (EUTHYROX) 112 MCG tablet Take 1 tablet (112 mcg total) by mouth daily.  . metFORMIN (GLUCOPHAGE) 500 MG tablet Take 1 tablet (500 mg total) by mouth 2 (two) times daily with a meal. (Patient taking differently: Take 500 mg by mouth daily. )  . metoprolol tartrate (LOPRESSOR) 50 MG tablet Take 50 mg by mouth daily.   . Multiple Vitamin (MULTIVITAMIN WITH MINERALS) TABS Take 1 tablet by mouth daily.  . simvastatin (ZOCOR) 20 MG tablet Take 1 tablet by mouth daily.  . [DISCONTINUED] hydrALAZINE (APRESOLINE) 25 MG tablet 1 tablet 2 (two) times daily.  . [DISCONTINUED] levothyroxine (EUTHYROX) 112 MCG tablet Take 1 tablet (112 mcg total) by mouth daily.  . [DISCONTINUED] simvastatin (ZOCOR) 40 MG tablet Take 1 tablet (40 mg total) by mouth daily.  . [DISCONTINUED] TURMERIC CURCUMIN PO Take 1 tablet by mouth daily.   No facility-administered encounter  medications on file as of 05/26/2020.   ALLERGIES: Allergies  Allergen Reactions  . Adhesive [Tape]     redness  . Codeine Nausea Only    Upset stomach   . Lisinopril   . Peanut-Containing Drug Products   . Shrimp [Shellfish Allergy]    VACCINATION STATUS: Immunization  History  Administered Date(s) Administered  . Influenza-Unspecified 04/13/2018  . Moderna SARS-COVID-2 Vaccination 09/24/2019, 10/26/2019    HPI  34--year-old female with medical history of  Graves' disease status post radioactive iodine ablation at approximately 74 years of age.     She is being engaged in telehealth via telephone for follow-up of RAI induced hypothyroidism, well-controlled type 2 diabetes, hyperlipidemia.     She is currently on levothyroxine 112 mcg p.o. daily before breakfast, Metformin 500 mg p.o. daily after breakfast     - She has been overweight/obese most of her adult life.  She reports steady weight over the last year.  . - She has no new complaints today. She denies cold/heat intolerance, palpitations, tremors, chest pain, shortness of breath. - She has family history of hypothyroidism in her mother.      Review of Systems   Limited as above.  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   She is advised to continue levothyroxine 112 mcg p.o. daily before breakfast.  - We discussed about the correct intake of her  thyroid hormone, on empty stomach at fasting, with water, separated by at least 30 minutes from breakfast and other medications,  and separated by more than 4 hours from calcium, iron, multivitamins, acid reflux medications (PPIs). -Patient is made aware of the fact that thyroid hormone replacement is needed for life, dose to be adjusted by periodic monitoring of thyroid function tests.  2. Diabetes mellitus without complication ([place]) -Her labs show her recent A1c is 5.7%.  She is responding and advised to continue Metformin 500 mg p.o. daily at breakfast.   - she  admits there is a room for improvement in her diet and drink choices. -  Suggestion is made for her to avoid simple carbohydrates  from her diet including Cakes, Sweet Desserts / Pastries, Ice Cream, Soda (diet and regular), Sweet Tea, Candies, Chips, Cookies, Sweet Pastries,  Store Bought Juices, Alcohol in Excess of  1-2 drinks a day, Artificial Sweeteners, Coffee Creamer, and "Sugar-free" Products. This will help patient to have stable blood glucose profile and potentially avoid unintended weight gain. .   3. Essential hypertension, benign Her blood pressure is not controlled to target. She is advised to continue her current blood pressure medications including  lisinopril-hydrochlorthiazide, and metoprolol.  Hydralazine 25 mg p.o. twice daily was recently added by her PMD.  4. Hyperlipidemia - From September 2018 her lipid profile was favorable including HDL of 87 and LDL of 73.  She was recently initiated on Zocor 20 mg p.o. nightly, advised to continue.       - I advised patient to maintain close follow up with Rosita Fire, MD for primary care needs.  -  Time spent on this patient care encounter:  20 minutes of which 50% was spent in  counseling and the rest reviewing  her current and  previous labs / studies and medications  doses and developing a plan for long term care. Cheyenne Benson  participated in the discussions,  expressed understanding, and voiced agreement with the above plans.  All questions were answered to her satisfaction. she is encouraged to contact clinic should she have any questions or concerns prior to her return visit.   Follow up plan: Return in about 6 months (around 11/24/2020) for F/U with Pre-visit Labs, A1c -NV, Urine MA - NV, ABI in Office NV.  Glade Lloyd, MD Phone: [phone]  Fax: [phone]  This note was partially dictated with voice recognition software. Similar sounding words can be transcribed inadequately or may not  be corrected upon review.  05/26/2020, 10:11 AM

## 2020-06-26 DIAGNOSIS — E119 Type 2 diabetes mellitus without complications: Secondary | ICD-10-CM | POA: Diagnosis not present

## 2020-06-26 DIAGNOSIS — E785 Hyperlipidemia, unspecified: Secondary | ICD-10-CM | POA: Diagnosis not present

## 2020-07-18 DIAGNOSIS — Z79899 Other long term (current) drug therapy: Secondary | ICD-10-CM | POA: Diagnosis not present

## 2020-07-18 DIAGNOSIS — Z1389 Encounter for screening for other disorder: Secondary | ICD-10-CM | POA: Diagnosis not present

## 2020-07-18 DIAGNOSIS — Z0001 Encounter for general adult medical examination with abnormal findings: Secondary | ICD-10-CM | POA: Diagnosis not present

## 2020-07-18 DIAGNOSIS — E039 Hypothyroidism, unspecified: Secondary | ICD-10-CM | POA: Diagnosis not present

## 2020-07-18 DIAGNOSIS — I10 Essential (primary) hypertension: Secondary | ICD-10-CM | POA: Diagnosis not present

## 2020-07-18 DIAGNOSIS — Z1331 Encounter for screening for depression: Secondary | ICD-10-CM | POA: Diagnosis not present

## 2020-07-18 DIAGNOSIS — E119 Type 2 diabetes mellitus without complications: Secondary | ICD-10-CM | POA: Diagnosis not present

## 2020-09-18 DIAGNOSIS — I10 Essential (primary) hypertension: Secondary | ICD-10-CM | POA: Diagnosis not present

## 2020-09-18 DIAGNOSIS — E119 Type 2 diabetes mellitus without complications: Secondary | ICD-10-CM | POA: Diagnosis not present

## 2020-10-05 ENCOUNTER — Other Ambulatory Visit: Payer: Self-pay | Admitting: "Endocrinology

## 2020-10-05 DIAGNOSIS — E890 Postprocedural hypothyroidism: Secondary | ICD-10-CM

## 2020-10-16 DIAGNOSIS — E119 Type 2 diabetes mellitus without complications: Secondary | ICD-10-CM | POA: Diagnosis not present

## 2020-10-16 DIAGNOSIS — I10 Essential (primary) hypertension: Secondary | ICD-10-CM | POA: Diagnosis not present

## 2020-11-16 DIAGNOSIS — I10 Essential (primary) hypertension: Secondary | ICD-10-CM | POA: Diagnosis not present

## 2020-11-16 DIAGNOSIS — E119 Type 2 diabetes mellitus without complications: Secondary | ICD-10-CM | POA: Diagnosis not present

## 2020-11-24 DIAGNOSIS — E890 Postprocedural hypothyroidism: Secondary | ICD-10-CM | POA: Diagnosis not present

## 2020-11-25 LAB — TSH: TSH: 0.372 u[IU]/mL — ABNORMAL LOW (ref 0.450–4.500)

## 2020-11-25 LAB — T4, FREE: Free T4: 1.77 ng/dL (ref 0.82–1.77)

## 2020-12-01 ENCOUNTER — Other Ambulatory Visit: Payer: Self-pay

## 2020-12-01 ENCOUNTER — Ambulatory Visit: Payer: Medicare PPO | Admitting: "Endocrinology

## 2020-12-01 ENCOUNTER — Encounter: Payer: Self-pay | Admitting: "Endocrinology

## 2020-12-01 VITALS — BP 122/74 | HR 64 | Ht 65.0 in | Wt 237.2 lb

## 2020-12-01 DIAGNOSIS — E782 Mixed hyperlipidemia: Secondary | ICD-10-CM

## 2020-12-01 DIAGNOSIS — E119 Type 2 diabetes mellitus without complications: Secondary | ICD-10-CM

## 2020-12-01 DIAGNOSIS — E890 Postprocedural hypothyroidism: Secondary | ICD-10-CM | POA: Diagnosis not present

## 2020-12-01 DIAGNOSIS — I10 Essential (primary) hypertension: Secondary | ICD-10-CM | POA: Diagnosis not present

## 2020-12-01 LAB — POCT GLYCOSYLATED HEMOGLOBIN (HGB A1C): HbA1c, POC (controlled diabetic range): 5.8 % (ref 0.0–7.0)

## 2020-12-01 MED ORDER — LEVOTHYROXINE SODIUM 100 MCG PO TABS: 100.0000 ug | ORAL_TABLET | Freq: Every day | ORAL | 1 refills | Status: DC

## 2020-12-01 MED ORDER — METFORMIN HCL 500 MG PO TABS: 500.0000 mg | ORAL_TABLET | Freq: Every day | ORAL | 1 refills | Status: DC

## 2020-12-01 NOTE — Progress Notes (Signed)
12/01/2020                   Endocrinology follow-up note  Subjective:    Patient ID: Cheyenne Benson, female    DOB: 08/25/1945, PCP Rosita Fire, MD   Past Medical History:  Diagnosis Date  . Arthritis   . Asthma   . Diabetes mellitus without complication ([facility])   . Dysrhythmia    "used to speed up a little-when my thyroid wa snot right, I'm on Metorpoolol no- no problem"  . Hypothyroidism    Past Surgical History:  Procedure Laterality Date  . [place] VITRECTOMY WITH 20 GAUGE MVR PORT FOR MACULAR HOLE Right 01/22/2013   Procedure: 25 GAUGE PARS PLANA VITRECTOMY WITH 20 GAUGE MVR PORT FOR MACULAR HOLE RIGHT EYE ;  Surgeon: Hayden Pedro, MD;  Location: [facility];  Service: Ophthalmology;  Laterality: Right;  . ABDOMINAL HYSTERECTOMY  1998  . AXILLARY SURGERY Right    removes lump and sweet glands  . buninectom    . BUNIONECTOMY Bilateral   . COLONOSCOPY N/A 06/06/2016   Procedure: COLONOSCOPY;  Surgeon: Daneil Dolin, MD;  Location: [facility];  Service: Endoscopy;  Laterality: N/A;  8:30 AM  . CYSTECTOMY     behind neck--- no anesthesia  . DG STYLOID PROCESS / TEMPORAL BONES    . EYE SURGERY Bilateral 4/20\14   cataract  . GAS/FLUID EXCHANGE Right 01/22/2013   Procedure: GAS/FLUID EXCHANGE;  Surgeon: Hayden Pedro, MD;  Location: [facility];  Service: Ophthalmology;  Laterality: Right;  [id number]  . LASER PHOTO ABLATION Right 01/22/2013   Procedure: LASER PHOTO ABLATION;  Surgeon: Hayden Pedro, MD;  Location: [facility];  Service: Ophthalmology;  Laterality: Right;  Headscope  . MEMBRANE PEEL Right 01/22/2013   Procedure: MEMBRANE PEEL;  Surgeon: Hayden Pedro, MD;  Location: [facility];  Service: Ophthalmology;  Laterality: Right;  . SERUM PATCH Right 01/22/2013   Procedure: SERUM PATCH;  Surgeon: Hayden Pedro, MD;  Location: [facility];  Service: Ophthalmology;  Laterality: Right;  . SKIN GRAFT Left    after axillary surgery  . TOE SURGERY     left bones  removed from 4 and 5 toe, Right foot 5th toe bone removed   Social History   Socioeconomic History  . Marital status: Married    Spouse name: Not on file  . Number of children: Not on file  . Years of education: Not on file  . Highest education level: Not on file  Occupational History  . Not on file  Tobacco Use  . Smoking status: Former Smoker    Years: 2.00  . Smokeless tobacco: Never Used  . Tobacco comment: maybe smoked a pack a month  Vaping Use  . Vaping Use: Never used  Substance and Sexual Activity  . Alcohol use: No  . Drug use: No  . Sexual activity: Not on file  Other Topics Concern  . Not on file  Social History Narrative  . Not on file   Social Determinants of Health   Financial Resource Strain: Not on file  Food Insecurity: Not on file  Transportation Needs: Not on file  Physical Activity: Not on file  Stress: Not on file  Social Connections: Not on file   Outpatient Encounter Medications as of 12/01/2020  Medication Sig  . amLODipine (NORVASC) 5 MG tablet Take 5  mg by mouth daily.  Marland Kitchen ADVAIR DISKUS 250-50 MCG/DOSE AEPB Inhale 1 puff into the lungs daily.  Marland Kitchen albuterol (PROAIR HFA) 108 (90 Base) MCG/ACT inhaler Inhale into the lungs daily as needed for wheezing or shortness of breath.  Marland Kitchen aspirin EC 81 MG tablet Take 81 mg by mouth daily.  . Cholecalciferol (VITAMIN D3) 50 MCG (2000 UT) TABS Take 1 tablet by mouth daily.  . COD LIVER OIL PO Take 650 mg by mouth daily.  . Flaxseed, Linseed, (FLAX SEED OIL PO) Take 1 tablet by mouth daily.  . hydrALAZINE (APRESOLINE) 50 MG tablet Take 1 tablet by mouth daily. (Patient not taking: Reported on 12/01/2020)  . hydrochlorothiazide (HYDRODIURIL) 25 MG tablet Take 25 mg by mouth daily.   Marland Kitchen levothyroxine (EUTHYROX) 100 MCG tablet Take 1 tablet (100 mcg total) by mouth daily before breakfast.  . metFORMIN (GLUCOPHAGE) 500 MG tablet Take 1 tablet (500 mg total) by mouth daily.  . metoprolol tartrate (LOPRESSOR) 50 MG  tablet Take 50 mg by mouth daily.   . Multiple Vitamin (MULTIVITAMIN WITH MINERALS) TABS Take 1 tablet by mouth daily.  . simvastatin (ZOCOR) 20 MG tablet Take 1 tablet by mouth daily.  . [DISCONTINUED] EUTHYROX 112 MCG tablet Take 1 tablet by mouth once daily  . [DISCONTINUED] metFORMIN (GLUCOPHAGE) 500 MG tablet Take 1 tablet (500 mg total) by mouth 2 (two) times daily with a meal. (Patient taking differently: Take 500 mg by mouth daily. )   No facility-administered encounter medications on file as of 12/01/2020.   ALLERGIES: Allergies  Allergen Reactions  . Adhesive [Tape]     redness  . Codeine Nausea Only    Upset stomach   . Lisinopril   . Peanut-Containing Drug Products   . Shrimp [Shellfish Allergy]    VACCINATION STATUS: Immunization History  Administered Date(s) Administered  . Influenza-Unspecified 04/13/2018  . Moderna Sars-Covid-2 Vaccination 09/24/2019, 10/26/2019    HPI  75--year-old female with medical history of  Graves' disease status post radioactive iodine ablation at approximate age of 83 years.     She is being seen in follow-up for RAI induced hypothyroidism, type 2 diabetes, hyperlipidemia, hypertension.  She is currently on levothyroxine 112 mcg p.o. daily before breakfast.  Her previsit labs are consistent with slight over replacement.  She made significant lifestyle changes and lost 15+ pounds since last visit.  She remains on metformin 500 mg p.o. daily after breakfast.  Her point-of-care A1c is 5.8%.     - She has been overweight/obese most of her adult life.   - She has no new complaints today. She denies cold/heat intolerance, palpitations, tremors, chest pain, shortness of breath. - She has family history of hypothyroidism in her mother.      Review of Systems   Limited as above.  Objective:    BP 122/74   Pulse 64   Ht 5\' 5"  (1.651 m)   Wt 237 lb 3.2 oz (107.6 kg)   BMI 39.47 kg/m   Wt Readings from Last 3 Encounters:  12/01/20 237 lb  3.2 oz (107.6 kg)  11/24/19 252 lb 6.4 oz (114.5 kg)  10/21/18 252 lb (114.3 kg)      Recent Results (from the past 2160 hour(s))  TSH     Status: Abnormal   Collection Time: 11/24/20  2:18 PM  Result Value Ref Range   TSH 0.372 (L) 0.450 - 4.500 uIU/mL  T4, free     Status: None   Collection Time: 11/24/20  2:18 PM  Result Value Ref Range   Free T4 1.77 0.82 - 1.77 ng/dL  HgB A1c     Status: None   Collection Time: 12/01/20  8:45 AM  Result Value Ref Range   Hemoglobin A1C     HbA1c POC (<> result, manual entry)     HbA1c, POC (prediabetic range)     HbA1c, POC (controlled diabetic range) 5.8 0.0 - 7.0 %   Lipid Panel     Component Value Date/Time   CHOL 185 11/17/2019 0738   TRIG 76 11/17/2019 0738   HDL 79 11/17/2019 0738   CHOLHDL 2.3 11/17/2019 0738   LDLCALC 89 11/17/2019 0738   CMP Latest Ref Rng & Units 03/16/2019 02/20/2018 04/19/2017  Glucose 65 - 99 mg/dL 111(H) 100 -  BUN 7 - 25 mg/dL 20 19 14   Creatinine 0.60 - 0.93 mg/dL 0.78 0.79 0.9  Sodium 135 - 146 mmol/L 141 143 -  Potassium 3.5 - 5.3 mmol/L 3.9 3.7 -  Chloride 98 - 110 mmol/L 101 106 -  CO2 20 - 32 mmol/L 29 30 -  Calcium 8.6 - 10.4 mg/dL 9.9 9.3 -  Total Protein 6.1 - 8.1 g/dL 7.4 7.1 -  Total Bilirubin 0.2 - 1.2 mg/dL 0.4 0.4 -  AST 10 - 35 U/L 13 16 -  ALT 6 - 29 U/L 12 13 -     Assessment & Plan:   1. 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  She is advised to lower her levothyroxine to 100 mcg p.o. daily before breakfast.   - We discussed about the correct intake of her thyroid hormone, on empty stomach at fasting, with water, separated by at least 30 minutes from breakfast and other medications,  and separated by more than 4 hours from calcium, iron, multivitamins, acid reflux medications (PPIs). -Patient is made aware of the fact that thyroid hormone replacement is needed for life, dose to be adjusted by periodic monitoring of  thyroid function tests.   2. Diabetes mellitus without complication ([facility]) -Her point-of-care A1c is 5.8%.  She has responded to low-dose metformin, advised to continue Metformin 500 mg p.o. daily at breakfast.   - she acknowledges that there is a room for improvement in her food and drink choices. - Suggestion is made for her to avoid simple carbohydrates  from her diet including Cakes, Sweet Desserts, Ice Cream, Soda (diet and regular), Sweet Tea, Candies, Chips, Cookies, Store Bought Juices, Alcohol in Excess of  1-2 drinks a day, Artificial Sweeteners,  Coffee Creamer, and "Sugar-free" Products, Lemonade. This will help patient to have more stable blood glucose profile and potentially avoid unintended weight gain.  3. Essential hypertension, benign Her blood pressure is control to target.  The weight loss has definitely helped.  She is advised to continue her current blood pressure medications including amlodipine 5 mg daily, hydrochlorothiazide 25 mg daily, metoprolol 50 mg p.o. daily.   She was taken off of hydralazine in the interval.  4. Hyperlipidemia -Her most recent lipid panel from April 2021 showed controlled LDL of 89.  She has favorable HDL of 79.  She is currently on simvastatin 20 mg p.o. nightly.  Advised to continue.      - I advised patient to maintain close follow up with Rosita Fire, MD for primary care needs.   I spent 32 minutes in the care of the patient today including review of labs from Thyroid Function,  CMP, and other relevant labs ; imaging/biopsy records (current and previous including abstractions from other facilities); face-to-face time discussing  her lab results and symptoms, medications doses, her options of short and long term treatment based on the latest standards of care / guidelines;   and documenting the encounter.  Clearance Coots  participated in the discussions, expressed understanding, and voiced agreement with the above plans.  All questions  were answered to her satisfaction. she is encouraged to contact clinic should she have any questions or concerns prior to her return visit.    Follow up plan: Return in about 6 months (around 06/02/2021) for F/U with Pre-visit Labs, A1c -NV, ABI in Office NV.  Glade Lloyd, MD Phone: [phone]  Fax: [phone]  This note was partially dictated with voice recognition software. Similar sounding words can be transcribed inadequately or may not  be corrected upon review.  12/01/2020, 9:23 AM

## 2020-12-01 NOTE — Patient Instructions (Signed)

## 2020-12-16 DIAGNOSIS — I10 Essential (primary) hypertension: Secondary | ICD-10-CM | POA: Diagnosis not present

## 2020-12-16 DIAGNOSIS — E119 Type 2 diabetes mellitus without complications: Secondary | ICD-10-CM | POA: Diagnosis not present

## 2021-01-30 DIAGNOSIS — I10 Essential (primary) hypertension: Secondary | ICD-10-CM | POA: Diagnosis not present

## 2021-01-30 DIAGNOSIS — E119 Type 2 diabetes mellitus without complications: Secondary | ICD-10-CM | POA: Diagnosis not present

## 2021-01-30 DIAGNOSIS — J4540 Moderate persistent asthma, uncomplicated: Secondary | ICD-10-CM | POA: Diagnosis not present

## 2021-01-31 DIAGNOSIS — I10 Essential (primary) hypertension: Secondary | ICD-10-CM | POA: Diagnosis not present

## 2021-01-31 DIAGNOSIS — H35033 Hypertensive retinopathy, bilateral: Secondary | ICD-10-CM | POA: Diagnosis not present

## 2021-01-31 DIAGNOSIS — H35341 Macular cyst, hole, or pseudohole, right eye: Secondary | ICD-10-CM | POA: Diagnosis not present

## 2021-01-31 DIAGNOSIS — H524 Presbyopia: Secondary | ICD-10-CM | POA: Diagnosis not present

## 2021-01-31 DIAGNOSIS — Z7984 Long term (current) use of oral hypoglycemic drugs: Secondary | ICD-10-CM | POA: Diagnosis not present

## 2021-01-31 DIAGNOSIS — H40001 Preglaucoma, unspecified, right eye: Secondary | ICD-10-CM | POA: Diagnosis not present

## 2021-01-31 DIAGNOSIS — E119 Type 2 diabetes mellitus without complications: Secondary | ICD-10-CM | POA: Diagnosis not present

## 2021-01-31 DIAGNOSIS — H26493 Other secondary cataract, bilateral: Secondary | ICD-10-CM | POA: Diagnosis not present

## 2021-01-31 DIAGNOSIS — Z961 Presence of intraocular lens: Secondary | ICD-10-CM | POA: Diagnosis not present

## 2021-03-01 DIAGNOSIS — E119 Type 2 diabetes mellitus without complications: Secondary | ICD-10-CM | POA: Diagnosis not present

## 2021-03-01 DIAGNOSIS — I10 Essential (primary) hypertension: Secondary | ICD-10-CM | POA: Diagnosis not present

## 2021-04-01 DIAGNOSIS — E119 Type 2 diabetes mellitus without complications: Secondary | ICD-10-CM | POA: Diagnosis not present

## 2021-04-01 DIAGNOSIS — I10 Essential (primary) hypertension: Secondary | ICD-10-CM | POA: Diagnosis not present

## 2021-04-19 ENCOUNTER — Encounter: Payer: Self-pay | Admitting: *Deleted

## 2021-05-02 DIAGNOSIS — E119 Type 2 diabetes mellitus without complications: Secondary | ICD-10-CM | POA: Diagnosis not present

## 2021-05-02 DIAGNOSIS — E785 Hyperlipidemia, unspecified: Secondary | ICD-10-CM | POA: Diagnosis not present

## 2021-05-02 DIAGNOSIS — I10 Essential (primary) hypertension: Secondary | ICD-10-CM | POA: Diagnosis not present

## 2021-05-16 ENCOUNTER — Telehealth: Payer: Self-pay | Admitting: *Deleted

## 2021-05-16 NOTE — Telephone Encounter (Signed)
Spoke to pt.  Scheduled nurse visit by phone on 06/01/2021 at 11:00.

## 2021-05-16 NOTE — Telephone Encounter (Signed)
PLEASE CALL PATIENT, SHE WAS ASKING IF WE RECEIVED HER QUESTION SHEET FOR HER TCS

## 2021-05-22 ENCOUNTER — Other Ambulatory Visit (HOSPITAL_COMMUNITY): Payer: Self-pay | Admitting: Internal Medicine

## 2021-05-22 DIAGNOSIS — Z1231 Encounter for screening mammogram for malignant neoplasm of breast: Secondary | ICD-10-CM

## 2021-05-25 ENCOUNTER — Ambulatory Visit (HOSPITAL_COMMUNITY)
Admission: RE | Admit: 2021-05-25 | Discharge: 2021-05-25 | Disposition: A | Discharge status: Home / Self Care | Payer: Medicare PPO | Source: Ambulatory Visit | Attending: Internal Medicine | Admitting: Internal Medicine

## 2021-05-25 ENCOUNTER — Encounter (HOSPITAL_COMMUNITY): Payer: Self-pay

## 2021-05-25 ENCOUNTER — Other Ambulatory Visit: Payer: Self-pay

## 2021-05-25 DIAGNOSIS — Z1231 Encounter for screening mammogram for malignant neoplasm of breast: Secondary | ICD-10-CM

## 2021-05-29 DIAGNOSIS — E890 Postprocedural hypothyroidism: Secondary | ICD-10-CM | POA: Diagnosis not present

## 2021-05-29 DIAGNOSIS — E782 Mixed hyperlipidemia: Secondary | ICD-10-CM | POA: Diagnosis not present

## 2021-05-30 LAB — LIPID PANEL
Chol/HDL Ratio: 2 ratio (ref 0.0–4.4)
Cholesterol, Total: 179 mg/dL (ref 100–199)
HDL: 90 mg/dL (ref >39)
LDL Chol Calc (NIH): 74 mg/dL (ref 0–99)
Triglycerides: 81 mg/dL (ref 0–149)
VLDL Cholesterol Cal: 15 mg/dL (ref 5–40)

## 2021-05-30 LAB — TSH: TSH: 6.28 u[IU]/mL — ABNORMAL HIGH (ref 0.450–4.500)

## 2021-05-30 LAB — T4, FREE: Free T4: 1.38 ng/dL (ref 0.82–1.77)

## 2021-06-01 ENCOUNTER — Encounter: Payer: Self-pay | Admitting: *Deleted

## 2021-06-01 ENCOUNTER — Other Ambulatory Visit: Payer: Self-pay

## 2021-06-01 ENCOUNTER — Ambulatory Visit (INDEPENDENT_AMBULATORY_CARE_PROVIDER_SITE_OTHER): Payer: Self-pay | Admitting: *Deleted

## 2021-06-01 VITALS — Ht 65.0 in | Wt 248.0 lb

## 2021-06-01 DIAGNOSIS — Z86010 Personal history of colonic polyps: Secondary | ICD-10-CM

## 2021-06-01 DIAGNOSIS — E119 Type 2 diabetes mellitus without complications: Secondary | ICD-10-CM | POA: Diagnosis not present

## 2021-06-01 DIAGNOSIS — I10 Essential (primary) hypertension: Secondary | ICD-10-CM | POA: Diagnosis not present

## 2021-06-01 MED ORDER — PEG 3350-KCL-NA BICARB-NACL 420 G PO SOLR: 4000.0000 mL | Freq: Once | ORAL | 0 refills | Status: AC

## 2021-06-01 NOTE — Progress Notes (Addendum)
Gastroenterology Pre-Procedure Review  Request Date: 06/01/2021 Requesting Physician: 5 year recall, Last TCS 06/06/2016 done by Dr. Gala Romney, tubular adenoma  PATIENT REVIEW QUESTIONS: The patient responded to the following health history questions as indicated:    1. Diabetes Melitis: yes, type II  2. Joint replacements in the past 12 months: no 3. Major health problems in the past 3 months: no 4. Has an artificial valve or MVP: no 5. Has a defibrillator: no 6. Has been advised in past to take antibiotics in advance of a procedure like teeth cleaning: yes, years ago but not anymore 7. Family history of colon cancer: no  8. Alcohol Use: no 9. Illicit drug Use: no 10. History of sleep apnea: no  11. History of coronary artery or other vascular stents placed within the last 12 months: no 12. History of any prior anesthesia complications: no 13. Body mass index is 41.27 kg/m.    MEDICATIONS & ALLERGIES:    Patient reports the following regarding taking any blood thinners:   Plavix? no Aspirin? Yes, 81 mg Coumadin? no Brilinta? no Xarelto? no Eliquis? no Pradaxa? no Savaysa? no Effient? no  Patient confirms/reports the following medications:  Current Outpatient Medications  Medication Sig Dispense Refill   ADVAIR DISKUS 250-50 MCG/DOSE AEPB Inhale 1 puff into the lungs daily.     albuterol (VENTOLIN HFA) 108 (90 Base) MCG/ACT inhaler Inhale into the lungs daily as needed for wheezing or shortness of breath.     amLODipine (NORVASC) 5 MG tablet Take 5 mg by mouth daily.     aspirin EC 81 MG tablet Take 81 mg by mouth daily.     Cholecalciferol (VITAMIN D3) 50 MCG (2000 UT) TABS Take 1 tablet by mouth daily.     COD LIVER OIL PO Take 650 mg by mouth daily.     Flaxseed, Linseed, (FLAX SEED OIL PO) Take 1 tablet by mouth daily.     hydrochlorothiazide (HYDRODIURIL) 25 MG tablet Take 25 mg by mouth daily.      levothyroxine (EUTHYROX) 100 MCG tablet Take 1 tablet (100 mcg total) by  mouth daily before breakfast. 90 tablet 1   metFORMIN (GLUCOPHAGE) 500 MG tablet Take 1 tablet (500 mg total) by mouth daily. 90 tablet 1   metoprolol tartrate (LOPRESSOR) 50 MG tablet Take 50 mg by mouth daily.      Multiple Vitamin (MULTIVITAMIN WITH MINERALS) TABS Take 1 tablet by mouth daily.     simvastatin (ZOCOR) 20 MG tablet Take 1 tablet by mouth at bedtime.     No current facility-administered medications for this visit.    Patient confirms/reports the following allergies:  Allergies  Allergen Reactions   Adhesive [Tape]     redness   Codeine Nausea Only    Upset stomach    Lisinopril    Peanut-Containing Drug Products    Shrimp [Shellfish Allergy]     No orders of the defined types were placed in this encounter.   [place] INFORMATION Primary Insurance: [place],  ID #: [id number],  Group #: [id number] Pre-Cert / Auth required: Yes, approved online [id number] Pre-Cert / Josem Kaufmann #: [id number]  SCHEDULE INFORMATION: Procedure has been scheduled as follows:  Date: 06/21/2021, Time: 1:00  Location: [facility] with Dr. Gala Romney  This Gastroenterology Pre-Precedure Review Form is being routed to the following provider(s): Aliene Altes, PA-C

## 2021-06-01 NOTE — Progress Notes (Signed)
Okay to schedule with Dr. Gala Romney with conscious sedation.  ASA 3.   Medication adjustments: Day prior to procedure: No medication adjustments. Day of procedure: No morning diabetes medications.

## 2021-06-02 ENCOUNTER — Encounter: Payer: Self-pay | Admitting: *Deleted

## 2021-06-02 NOTE — Progress Notes (Signed)
Spoke to pt.  Informed her no medication adjustments needed the day before procedure.  She was also informed that she should not take any morning diabetes medication the day of procedure.  She voiced understanding.  Mailing out letter for pt as well.

## 2021-06-05 ENCOUNTER — Ambulatory Visit (INDEPENDENT_AMBULATORY_CARE_PROVIDER_SITE_OTHER): Payer: Medicare PPO | Admitting: "Endocrinology

## 2021-06-05 ENCOUNTER — Encounter: Payer: Self-pay | Admitting: "Endocrinology

## 2021-06-05 VITALS — BP 146/79 | HR 44 | Ht 65.0 in | Wt 247.8 lb

## 2021-06-05 DIAGNOSIS — E119 Type 2 diabetes mellitus without complications: Secondary | ICD-10-CM | POA: Diagnosis not present

## 2021-06-05 DIAGNOSIS — E890 Postprocedural hypothyroidism: Secondary | ICD-10-CM | POA: Diagnosis not present

## 2021-06-05 DIAGNOSIS — E782 Mixed hyperlipidemia: Secondary | ICD-10-CM | POA: Diagnosis not present

## 2021-06-05 DIAGNOSIS — I10 Essential (primary) hypertension: Secondary | ICD-10-CM | POA: Diagnosis not present

## 2021-06-05 LAB — POCT GLYCOSYLATED HEMOGLOBIN (HGB A1C): HbA1c, POC (controlled diabetic range): 6 % (ref 0.0–7.0)

## 2021-06-05 MED ORDER — LEVOTHYROXINE SODIUM 112 MCG PO TABS: 112.0000 ug | ORAL_TABLET | Freq: Every day | ORAL | 1 refills | Status: DC

## 2021-06-05 NOTE — Progress Notes (Signed)
06/05/2021                   Endocrinology follow-up note  Subjective:    Patient ID: Cheyenne Benson, female    DOB: 12-Jan-1946, PCP Rosita Fire, MD   Past Medical History:  Diagnosis Date   Arthritis    Asthma    Diabetes mellitus without complication ([place])    Dysrhythmia    "used to speed up a little-when my thyroid wa snot right, I'm on Metorpoolol no- no problem"   Hypothyroidism    Past Surgical History:  Procedure Laterality Date   25 GAUGE PARS PLANA VITRECTOMY WITH 20 GAUGE MVR PORT FOR MACULAR HOLE Right 01/22/2013   Procedure: 25 GAUGE PARS PLANA VITRECTOMY WITH 20 GAUGE MVR PORT FOR MACULAR HOLE RIGHT EYE ;  Surgeon: Hayden Pedro, MD;  Location: [place];  Service: Ophthalmology;  Laterality: Right;   ABDOMINAL HYSTERECTOMY  1998   AXILLARY SURGERY Right    removes lump and sweet glands   buninectom     BUNIONECTOMY Bilateral    COLONOSCOPY N/A 06/06/2016   Procedure: COLONOSCOPY;  Surgeon: Daneil Dolin, MD;  [facility];  Service: Endoscopy;  Laterality: N/A;  8:30 AM   CYSTECTOMY     behind neck--- no anesthesia   DG STYLOID PROCESS / TEMPORAL BONES     EYE SURGERY Bilateral 4/20\14   cataract   GAS/FLUID EXCHANGE Right 01/22/2013   Procedure: GAS/FLUID EXCHANGE;  Surgeon: Hayden Pedro, MD;  Location: [place];  Service: Ophthalmology;  Laterality: Right;  C3F8   LASER PHOTO ABLATION Right 01/22/2013   Procedure: LASER PHOTO ABLATION;  Surgeon: Hayden Pedro, MD;  Location: [place];  Service: Ophthalmology;  Laterality: Right;  Headscope   MEMBRANE PEEL Right 01/22/2013   Procedure: MEMBRANE PEEL;  Surgeon: Hayden Pedro, MD;  Location: [place];  Service: Ophthalmology;  Laterality: Right;   SERUM PATCH Right 01/22/2013   Procedure: SERUM PATCH;  Surgeon: Hayden Pedro, MD;  Location: [place];  Service: Ophthalmology;  Laterality: Right;   SKIN GRAFT Left    after axillary surgery   TOE SURGERY     left bones removed from 4 and 5  toe, Right foot 5th toe bone removed   Social History   Socioeconomic History   Marital status: Married    Spouse name: Not on file   Number of children: Not on file   Years of education: Not on file   Highest education level: Not on file  Occupational History   Not on file  Tobacco Use   Smoking status: Former    Years: 2.00    Types: Cigarettes   Smokeless tobacco: Never   Tobacco comments:    maybe smoked a pack a month  Vaping Use   Vaping Use: Never used  Substance and Sexual Activity   Alcohol use: No   Drug use: No   Sexual activity: Not on file  Other Topics Concern   Not on file  Social History Narrative   Not on file   Social Determinants of Health   Financial Resource Strain: Not on file  Food Insecurity: Not on file  Transportation Needs: Not on file  Physical Activity: Not on file  Stress: Not on file  Social Connections: Not on file   Outpatient Encounter Medications as of 06/05/2021  Medication Sig   ADVAIR  DISKUS 250-50 MCG/DOSE AEPB Inhale 1 puff into the lungs daily.   albuterol (VENTOLIN HFA) 108 (90 Base) MCG/ACT inhaler Inhale into the lungs daily as needed for wheezing or shortness of breath.   amLODipine (NORVASC) 5 MG tablet Take 5 mg by mouth daily.   aspirin EC 81 MG tablet Take 81 mg by mouth daily.   Cholecalciferol (VITAMIN D3) 50 MCG (2000 UT) TABS Take 1 tablet by mouth daily.   COD LIVER OIL PO Take 650 mg by mouth daily.   Flaxseed, Linseed, (FLAX SEED OIL PO) Take 1 tablet by mouth daily.   hydrochlorothiazide (HYDRODIURIL) 25 MG tablet Take 25 mg by mouth daily.    levothyroxine (EUTHYROX) 112 MCG tablet Take 1 tablet (112 mcg total) by mouth daily before breakfast.   metFORMIN (GLUCOPHAGE) 500 MG tablet Take 1 tablet (500 mg total) by mouth daily.   metoprolol tartrate (LOPRESSOR) 50 MG tablet Take 50 mg by mouth daily.    Multiple Vitamin (MULTIVITAMIN WITH MINERALS) TABS Take 1 tablet by mouth daily.   simvastatin (ZOCOR) 20  MG tablet Take 1 tablet by mouth at bedtime.   [DISCONTINUED] levothyroxine (EUTHYROX) 100 MCG tablet Take 1 tablet (100 mcg total) by mouth daily before breakfast.   No facility-administered encounter medications on file as of 06/05/2021.   ALLERGIES: Allergies  Allergen Reactions   Adhesive [Tape]     redness   Codeine Nausea Only    Upset stomach    Lisinopril    Peanut-Containing Drug Products    Shrimp [Shellfish Allergy]    VACCINATION STATUS: Immunization History  Administered Date(s) Administered   Influenza-Unspecified 04/13/2018   Moderna Sars-Covid-2 Vaccination 09/24/2019, 10/26/2019    HPI  75--year-old female with medical history of  Graves' disease status post radioactive iodine ablation at approximate age of 89 years.     She is being seen in follow-up for RAI induced hypothyroidism, type 2 diabetes, hyperlipidemia, hypertension.  She is currently on levothyroxine 100 mcg p.o. daily before breakfast.  Her previsit labs are consistent with slight over replacement.  She made significant lifestyle changes and lost 15+ pounds since last visit.  She remains on metformin 500 mg p.o. daily after breakfast.  Her point-of-care A1c is 6%.     - She has been overweight/obese most of her adult life.  She has gained approximately 10 pounds since last visit. - She has no new complaints today. She denies cold/heat intolerance, palpitations, tremors, chest pain, shortness of breath. - She has family history of hypothyroidism in her mother.      Review of Systems   Limited as above.  Objective:    BP (!) 146/79   Pulse (!) 44   Ht 5\' 5"  (1.651 m)   Wt 247 lb 12.8 oz (112.4 kg)   BMI 41.24 kg/m   Wt Readings from Last 3 Encounters:  06/05/21 247 lb 12.8 oz (112.4 kg)  06/01/21 248 lb (112.5 kg)  12/01/20 237 lb 3.2 oz (107.6 kg)      Recent Results (from the past 2160 hour(s))  TSH     Status: Abnormal   Collection Time: 05/29/21 10:00 AM  Result Value Ref Range    TSH 6.280 (H) 0.450 - 4.500 uIU/mL  T4, free     Status: None   Collection Time: 05/29/21 10:00 AM  Result Value Ref Range   Free T4 1.38 0.82 - 1.77 ng/dL  Lipid panel     Status: None   Collection Time: 05/29/21 10:00  AM  Result Value Ref Range   Cholesterol, Total 179 100 - 199 mg/dL   Triglycerides 81 0 - 149 mg/dL   HDL 90 >39 mg/dL   VLDL Cholesterol Cal 15 5 - 40 mg/dL   LDL Chol Calc (NIH) 74 0 - 99 mg/dL   Chol/HDL Ratio 2.0 0.0 - 4.4 ratio    Comment:                                   T. Chol/HDL Ratio                                             Men  Women                               1/2 Avg.Risk  3.4    3.3                                   Avg.Risk  5.0    4.4                                2X Avg.Risk  9.6    7.1                                3X Avg.Risk 23.4   11.0   HgB A1c     Status: None   Collection Time: 06/05/21  2:48 PM  Result Value Ref Range   Hemoglobin A1C     HbA1c POC (<> result, manual entry)     HbA1c, POC (prediabetic range)     HbA1c, POC (controlled diabetic range) 6.0 0.0 - 7.0 %   Lipid Panel     Component Value Date/Time   CHOL 179 05/29/2021 1000   TRIG 81 05/29/2021 1000   HDL 90 05/29/2021 1000   CHOLHDL 2.0 05/29/2021 1000   CHOLHDL 2.3 11/17/2019 0738   LDLCALC 74 05/29/2021 1000   LDLCALC 89 11/17/2019 0738   LABVLDL 15 05/29/2021 1000   CMP Latest Ref Rng & Units 03/16/2019 02/20/2018 04/19/2017  Glucose 65 - 99 mg/dL 111(H) 100 -  BUN 7 - 25 mg/dL 20 19 14   Creatinine 0.60 - 0.93 mg/dL 0.78 0.79 0.9  Sodium 135 - 146 mmol/L 141 143 -  Potassium 3.5 - 5.3 mmol/L 3.9 3.7 -  Chloride 98 - 110 mmol/L 101 106 -  CO2 20 - 32 mmol/L 29 30 -  Calcium 8.6 - 10.4 mg/dL 9.9 9.3 -  Total Protein 6.1 - 8.1 g/dL 7.4 7.1 -  Total Bilirubin 0.2 - 1.2 mg/dL 0.4 0.4 -  AST 10 - 35 U/L 13 16 -  ALT 6 - 29 U/L 12 13 -     Assessment & Plan:   1. 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  She would benefit from slight increase in the dose of her levothyroxine.  I discussed and prescribe levothyroxine 112 mcg p.o. daily before breakfast.     - We discussed  about the correct intake of her thyroid hormone, on empty stomach at fasting, with water, separated by at least 30 minutes from breakfast and other medications,  and separated by more than 4 hours from calcium, iron, multivitamins, acid reflux medications (PPIs). -Patient is made aware of the fact that thyroid hormone replacement is needed for life, dose to be adjusted by periodic monitoring of thyroid function tests.   2. Diabetes mellitus without complication ([facility]) -Her point-of-care A1c is 6%.  She has responded to low-dose metformin, advised to continue metformin 500 mg p.o. daily after breakfast.    - she acknowledges that there is a room for improvement in her food and drink choices. - Suggestion is made for her to avoid simple carbohydrates  from her diet including Cakes, Sweet Desserts, Ice Cream, Soda (diet and regular), Sweet Tea, Candies, Chips, Cookies, Store Bought Juices, Alcohol in Excess of  1-2 drinks a day, Artificial Sweeteners,  Coffee Creamer, and "Sugar-free" Products, Lemonade. This will help patient to have more stable blood glucose profile and potentially avoid unintended weight gain.  3. Essential hypertension, benign Her blood pressure is control to target.  The weight loss has definitely helped.  She is advised to continue her current blood pressure medications including amlodipine 5 mg daily, hydrochlorothiazide 25 mg daily, metoprolol 50 mg p.o. daily.   She was taken off of hydralazine in the interval.  4. Hyperlipidemia -Her most recent lipid panel from April 2021 showed controlled LDL of 74.  She is benefiting from the simvastatin.  She is advised to continue  simvastatin 20 mg p.o. nightly.      - I advised patient to maintain close follow up with Rosita Fire, MD for  primary care needs.   I spent 31 minutes in the care of the patient today including review of labs from Thyroid Function, CMP, and other relevant labs ; imaging/biopsy records (current and previous including abstractions from other facilities); face-to-face time discussing  her lab results and symptoms, medications doses, her options of short and long term treatment based on the latest standards of care / guidelines;   and documenting the encounter.  Clearance Coots  participated in the discussions, expressed understanding, and voiced agreement with the above plans.  All questions were answered to her satisfaction. she is encouraged to contact clinic should she have any questions or concerns prior to her return visit.     Follow up plan: Return in about 6 months (around 12/04/2021) for F/U with Pre-visit Labs, A1c -NV.  Glade Lloyd, MD Phone: [phone]  Fax: [phone]  This note was partially dictated with voice recognition software. Similar sounding words can be transcribed inadequately or may not  be corrected upon review.  06/05/2021, 5:34 PM

## 2021-06-05 NOTE — Patient Instructions (Signed)

## 2021-06-21 ENCOUNTER — Encounter (HOSPITAL_COMMUNITY): Payer: Self-pay | Admitting: Internal Medicine

## 2021-06-21 ENCOUNTER — Other Ambulatory Visit: Payer: Self-pay

## 2021-06-21 ENCOUNTER — Encounter (HOSPITAL_COMMUNITY)
Admission: RE | Disposition: A | Discharge status: Home / Self Care | Payer: Self-pay | Source: Home / Self Care | Attending: Internal Medicine

## 2021-06-21 ENCOUNTER — Ambulatory Visit (HOSPITAL_COMMUNITY)
Admission: RE | Admit: 2021-06-21 | Discharge: 2021-06-21 | Disposition: A | Discharge status: Home / Self Care | Payer: Medicare PPO | Attending: Internal Medicine | Admitting: Internal Medicine

## 2021-06-21 DIAGNOSIS — Z1211 Encounter for screening for malignant neoplasm of colon: Secondary | ICD-10-CM | POA: Insufficient documentation

## 2021-06-21 DIAGNOSIS — D122 Benign neoplasm of ascending colon: Secondary | ICD-10-CM | POA: Insufficient documentation

## 2021-06-21 DIAGNOSIS — K621 Rectal polyp: Secondary | ICD-10-CM | POA: Insufficient documentation

## 2021-06-21 DIAGNOSIS — K5730 Diverticulosis of large intestine without perforation or abscess without bleeding: Secondary | ICD-10-CM | POA: Diagnosis not present

## 2021-06-21 DIAGNOSIS — D123 Benign neoplasm of transverse colon: Secondary | ICD-10-CM | POA: Insufficient documentation

## 2021-06-21 DIAGNOSIS — E1136 Type 2 diabetes mellitus with diabetic cataract: Secondary | ICD-10-CM | POA: Diagnosis not present

## 2021-06-21 DIAGNOSIS — Z86010 Personal history of colonic polyps: Secondary | ICD-10-CM | POA: Diagnosis not present

## 2021-06-21 DIAGNOSIS — K635 Polyp of colon: Secondary | ICD-10-CM | POA: Diagnosis not present

## 2021-06-21 DIAGNOSIS — K641 Second degree hemorrhoids: Secondary | ICD-10-CM | POA: Insufficient documentation

## 2021-06-21 DIAGNOSIS — D126 Benign neoplasm of colon, unspecified: Secondary | ICD-10-CM | POA: Diagnosis not present

## 2021-06-21 HISTORY — PX: POLYPECTOMY: SHX5525

## 2021-06-21 HISTORY — PX: COLONOSCOPY: SHX5424

## 2021-06-21 LAB — GLUCOSE, CAPILLARY: Glucose-Capillary: 105 mg/dL — ABNORMAL HIGH (ref 70–99)

## 2021-06-21 SURGERY — COLONOSCOPY
Anesthesia: Moderate Sedation

## 2021-06-21 MED ORDER — ONDANSETRON HCL 4 MG/2ML IJ SOLN
INTRAMUSCULAR | Status: DC | PRN
  Administered 2021-06-21: 4 mg via INTRAVENOUS

## 2021-06-21 MED ORDER — MIDAZOLAM HCL 5 MG/5ML IJ SOLN
INTRAMUSCULAR | Status: DC | PRN
  Administered 2021-06-21: 2 mg via INTRAVENOUS
  Administered 2021-06-21 (×3): 1 mg via INTRAVENOUS

## 2021-06-21 MED ORDER — MIDAZOLAM HCL 5 MG/5ML IJ SOLN
INTRAMUSCULAR | Status: AC
  Filled 2021-06-21: qty 10

## 2021-06-21 MED ORDER — MEPERIDINE HCL 50 MG/ML IJ SOLN
INTRAMUSCULAR | Status: AC
  Filled 2021-06-21: qty 1

## 2021-06-21 MED ORDER — MEPERIDINE HCL 100 MG/ML IJ SOLN
INTRAMUSCULAR | Status: DC | PRN
  Administered 2021-06-21: 25 mg via INTRAVENOUS

## 2021-06-21 MED ORDER — ONDANSETRON HCL 4 MG/2ML IJ SOLN
INTRAMUSCULAR | Status: AC
  Filled 2021-06-21: qty 2

## 2021-06-21 MED ORDER — SODIUM CHLORIDE 0.9 % IV SOLN
INTRAVENOUS | Status: DC
  Administered 2021-06-21: 1000 mL via INTRAVENOUS

## 2021-06-21 NOTE — Discharge Instructions (Signed)
  Colonoscopy Discharge Instructions  Read the instructions outlined below and refer to this sheet in the next few weeks. These discharge instructions provide you with general information on caring for yourself after you leave the hospital. Your doctor may also give you specific instructions. While your treatment has been planned according to the most current medical practices available, unavoidable complications occasionally occur. If you have any problems or questions after discharge, call Dr. Gala Romney at [phone]. ACTIVITY You may resume your regular activity, but move at a slower pace for the next 24 hours.  Take frequent rest periods for the next 24 hours.  Walking will help get rid of the air and reduce the bloated feeling in your belly (abdomen).  No driving for 24 hours (because of the medicine (anesthesia) used during the test).   Do not sign any important legal documents or operate any machinery for 24 hours (because of the anesthesia used during the test).  NUTRITION Drink plenty of fluids.  You may resume your normal diet as instructed by your doctor.  Begin with a light meal and progress to your normal diet. Heavy or fried foods are harder to digest and may make you feel sick to your stomach (nauseated).  Avoid alcoholic beverages for 24 hours or as instructed.  MEDICATIONS You may resume your normal medications unless your doctor tells you otherwise.  WHAT YOU CAN EXPECT TODAY Some feelings of bloating in the abdomen.  Passage of more gas than usual.  Spotting of blood in your stool or on the toilet paper.  IF YOU HAD POLYPS REMOVED DURING THE COLONOSCOPY: No aspirin products for 7 days or as instructed.  No alcohol for 7 days or as instructed.  Eat a soft diet for the next 24 hours.  FINDING OUT THE RESULTS OF YOUR TEST Not all test results are available during your visit. If your test results are not back during the visit, make an appointment with your caregiver to find out the  results. Do not assume everything is normal if you have not heard from your caregiver or the medical facility. It is important for you to follow up on all of your test results.  SEEK IMMEDIATE MEDICAL ATTENTION IF: You have more than a spotting of blood in your stool.  Your belly is swollen (abdominal distention).  You are nauseated or vomiting.  You have a temperature over 101.  You have abdominal pain or discomfort that is severe or gets worse throughout the day.      3 polyps removed from your colon today  Diverticulosis and colon polyp information provided   further recommendations to follow pending review of pathology report  At patient request, I called [facility] [employer] at [phone] -

## 2021-06-21 NOTE — H&P (Signed)
@LOGO @   Primary Care Physician:  Rosita Fire, MD Primary Gastroenterologist:  Dr. Gala Romney  Pre-Procedure History & Physical: HPI:  Cheyenne Benson is a 75 y.o. female here for  surveillance colonoscopy.  History colonic adenoma removed 5 years ago.  Clinically doing well at this time.    Past Medical History:  Diagnosis Date   Arthritis    Asthma    Diabetes mellitus without complication ([place])    Dysrhythmia    "used to speed up a little-when my thyroid wa snot right, I'm on Metorpoolol no- no problem"   Hypothyroidism     Past Surgical History:  Procedure Laterality Date   25 GAUGE PARS PLANA VITRECTOMY WITH 20 GAUGE MVR PORT FOR MACULAR HOLE Right 01/22/2013   Procedure: 25 GAUGE PARS PLANA VITRECTOMY WITH 20 GAUGE MVR PORT FOR MACULAR HOLE RIGHT EYE ;  Surgeon: Hayden Pedro, MD;  Location: [place];  Service: Ophthalmology;  Laterality: Right;   ABDOMINAL HYSTERECTOMY  1998   AXILLARY SURGERY Right    removes lump and sweet glands   buninectom     BUNIONECTOMY Bilateral    COLONOSCOPY N/A 06/06/2016   Procedure: COLONOSCOPY;  Surgeon: Daneil Dolin, MD;  [facility];  Service: Endoscopy;  Laterality: N/A;  8:30 AM   CYSTECTOMY     behind neck--- no anesthesia   DG STYLOID PROCESS / TEMPORAL BONES     EYE SURGERY Bilateral 4/20\14   cataract   GAS/FLUID EXCHANGE Right 01/22/2013   Procedure: GAS/FLUID EXCHANGE;  Surgeon: Hayden Pedro, MD;  Location: [place];  Service: Ophthalmology;  Laterality: Right;  C3F8   LASER PHOTO ABLATION Right 01/22/2013   Procedure: LASER PHOTO ABLATION;  Surgeon: Hayden Pedro, MD;  Location: [place];  Service: Ophthalmology;  Laterality: Right;  Headscope   MEMBRANE PEEL Right 01/22/2013   Procedure: MEMBRANE PEEL;  Surgeon: Hayden Pedro, MD;  Location: [place];  Service: Ophthalmology;  Laterality: Right;   SERUM PATCH Right 01/22/2013   Procedure: SERUM PATCH;  Surgeon: Hayden Pedro, MD;  Location: [place];  Service:  Ophthalmology;  Laterality: Right;   SKIN GRAFT Left    after axillary surgery   TOE SURGERY     left bones removed from 4 and 5 toe, Right foot 5th toe bone removed    Prior to Admission medications   Medication Sig Start Date End Date Taking? Authorizing Provider  ADVAIR DISKUS 250-50 MCG/DOSE AEPB Inhale 2 puffs into the lungs daily. 11/11/12  Yes [provider]  albuterol (VENTOLIN HFA) 108 (90 Base) MCG/ACT inhaler Inhale 2 puffs into the lungs every 6 (six) hours as needed for wheezing or shortness of breath.   Yes [provider]  amLODipine (NORVASC) 5 MG tablet Take 5 mg by mouth daily.   Yes [provider]  aspirin EC 81 MG tablet Take 81 mg by mouth daily.   Yes [provider]  Cholecalciferol (VITAMIN D3) 125 MCG (5000 UT) TABS Take 5,000 Units by mouth at bedtime.   Yes [provider]  COD LIVER OIL PO Take 1 capsule by mouth at bedtime.   Yes [provider]  Diphenhydramine-APAP (PERCOGESIC) 12.5-325 MG TABS Take 1 tablet by mouth daily as needed (mild pain).   Yes [provider]  Flaxseed, Linseed, (FLAX SEED OIL PO) Take 1 capsule by mouth at bedtime.   Yes [provider]  hydrochlorothiazide (HYDRODIURIL) 25 MG tablet Take 25 mg by mouth daily.  Yes [provider]  ketotifen (THERATEARS ALLERGY) 0.025 % ophthalmic solution Place 1 drop into both eyes 2 (two) times daily.   Yes [provider]  levothyroxine (EUTHYROX) 112 MCG tablet Take 1 tablet (112 mcg total) by mouth daily before breakfast. 06/05/21  Yes Nida, Marella Chimes, MD  Menatetrenone (VITAMIN K2) 100 MCG TABS Take 100 mcg by mouth at bedtime.   Yes [provider]  metFORMIN (GLUCOPHAGE) 500 MG tablet Take 1 tablet (500 mg total) by mouth daily. 12/01/20  Yes Nida, Marella Chimes, MD  metoprolol tartrate (LOPRESSOR) 50 MG tablet Take 50 mg by mouth daily.  11/11/12  Yes [provider]  Multiple  Vitamin (MULTIVITAMIN WITH MINERALS) TABS Take 1 tablet by mouth daily.   Yes [provider]  simvastatin (ZOCOR) 20 MG tablet Take 20 mg by mouth at bedtime. 05/20/20  Yes [provider]    Allergies as of 06/02/2021 - Review Complete 06/01/2021  Allergen Reaction Noted   Adhesive [tape]  01/22/2013   Codeine Nausea Only 07/28/2015   Lisinopril  02/27/2018   Peanut-containing drug products  02/27/2018   Shrimp [shellfish allergy]  02/27/2018    No family history on file.  Social History   Socioeconomic History   Marital status: Married    Spouse name: Not on file   Number of children: Not on file   Years of education: Not on file   Highest education level: Not on file  Occupational History   Not on file  Tobacco Use   Smoking status: Former    Years: 2.00    Types: Cigarettes   Smokeless tobacco: Never   Tobacco comments:    maybe smoked a pack a month  Vaping Use   Vaping Use: Never used  Substance and Sexual Activity   Alcohol use: No   Drug use: No   Sexual activity: Not on file  Other Topics Concern   Not on file  Social History Narrative   Not on file   Social Determinants of Health   Financial Resource Strain: Not on file  Food Insecurity: Not on file  Transportation Needs: Not on file  Physical Activity: Not on file  Stress: Not on file  Social Connections: Not on file  Intimate Partner Violence: Not on file    Review of Systems: See HPI, otherwise negative ROS  Physical Exam: There were no vitals taken for this visit. General:   Alert,  Well-developed, well-nourished, pleasant and cooperative in NAD SNeck:  Supple; no masses or thyromegaly. No significant cervical adenopathy. Lungs:  Clear throughout to auscultation.   No wheezes, crackles, or rhonchi. No acute distress. Heart:  Regular rate and rhythm; no murmurs, clicks, rubs,  or gallops. Abdomen: Non-distended, normal bowel sounds.  Soft and nontender without appreciable  mass or hepatosplenomegaly.  Pulses:  Normal pulses noted. Extremities:  Without clubbing or edema.  Impression/Plan:   75 year old lady here for a surveillance colonoscopy.  History of colonic adenoma removed previously.    I have offered the patient a surveillance colonoscopy today per plan. The risks, benefits, limitations, alternatives and imponderables have been reviewed with the patient. Questions have been answered. All parties are agreeable.       Notice: This dictation was prepared with Dragon dictation along with smaller phrase technology. Any transcriptional errors that result from this process are unintentional and may not be corrected upon review.

## 2021-06-21 NOTE — Op Note (Signed)
[facility] Patient Name: Cheyenne Benson Procedure Date: 06/21/2021 11:01 AM MRN: [id number] Date of Birth: 16-Jul-1946 Attending MD: Norvel Richards , MD CSN: [id number] Age: 75 Admit Type: Outpatient Procedure:                Colonoscopy Indications:              High risk colon cancer surveillance: Personal                            history of colonic polyps Providers:                Norvel Richards, MD, Charlsie Quest. Theda Sers RN, RN,                            Wynonia Musty Tech, Technician Referring MD:              Medicines:                Midazolam 5 mg IV, Meperidine 40 mg IV Complications:            No immediate complications. Estimated Blood Loss:     Estimated blood loss was minimal. Procedure:                Pre-Anesthesia Assessment:                           - Prior to the procedure, a History and Physical                            was performed, and patient medications and                            allergies were reviewed. The patient's tolerance of                            previous anesthesia was also reviewed. The risks                            and benefits of the procedure and the sedation                            options and risks were discussed with the patient.                            All questions were answered, and informed consent                            was obtained. Prior Anticoagulants: The patient has                            taken no previous anticoagulant or antiplatelet                            agents. ASA Grade Assessment: III - A patient with  severe systemic disease. After reviewing the risks                            and benefits, the patient was deemed in                            satisfactory condition to undergo the procedure.                           After obtaining informed consent, the colonoscope                            was passed under direct vision. Throughout the                             procedure, the patient's blood pressure, pulse, and                            oxygen saturations were monitored continuously. The                            [id number]) scope was introduced through the                            anus and advanced to the the cecum, identified by                            appendiceal orifice and ileocecal valve. The                            colonoscopy was performed without difficulty. The                            patient tolerated the procedure well. The quality                            of the bowel preparation was adequate. The                            ileocecal valve, appendiceal orifice, and rectum                            were photographed. The entire colon was well                            visualized. Scope In: 12:31:48 PM Scope Out: 12:47:49 PM Scope Withdrawal Time: 0 hours 11 minutes 31 seconds  Total Procedure Duration: 0 hours 16 minutes 1 second  Findings:      The perianal and digital rectal examinations were normal.      Scattered small and large-mouthed diverticula were found in the entire       colon.      Three sessile polyps were found in the proximal rectum, splenic flexure       and ascending colon. The polyps were 3 to  5 mm in size. These polyps       were removed with a cold snare. Resection and retrieval were complete.       Estimated blood loss was minimal. Estimated blood loss was minimal.      Non-bleeding internal hemorrhoids were found during retroflexion. The       hemorrhoids were moderate, medium-sized and Grade II (internal       hemorrhoids that prolapse but reduce spontaneously).      The exam was otherwise without abnormality on direct and retroflexion       views. Impression:               - Diverticulosis in the entire examined colon.                           - Three 3 to 5 mm polyps in the proximal rectum, at                            the splenic flexure and in the ascending colon,                             removed with a cold snare. Resected and retrieved.                           - Non-bleeding internal hemorrhoids.                           - The examination was otherwise normal on direct                            and retroflexion views. Moderate Sedation:      Moderate (conscious) sedation was administered by the endoscopy nurse       and supervised by the endoscopist. The following parameters were       monitored: oxygen saturation, heart rate, blood pressure, respiratory       rate, EKG, adequacy of pulmonary ventilation, and response to care.       Total physician intraservice time was 20 minutes. Recommendation:           - Patient has a contact number available for                            emergencies. The signs and symptoms of potential                            delayed complications were discussed with the                            patient. Return to normal activities tomorrow.                            Written discharge instructions were provided to the                            patient.                           -  Resume previous diet.                           - Continue present medications.                           - Repeat colonoscopy after studies are complete for                            surveillance.                           - Return to GI office (date not yet determined). Procedure Code(s):        --- Professional ---                           [phone], Colonoscopy, flexible; with removal of                            tumor(s), polyp(s), or other lesion(s) by snare                            technique                           G0500, Moderate sedation services provided by the                            same physician or other qualified health care                            professional performing a gastrointestinal                            endoscopic service that sedation supports,                            requiring the presence of  an independent trained                            observer to assist in the monitoring of the                            patient's level of consciousness and physiological                            status; initial 15 minutes of intra-service time;                            patient age 62 years or older (additional time may                            be reported with [phone], as appropriate) Diagnosis Code(s):        --- Professional ---  Z86.010, Personal history of colonic polyps                           K64.1, Second degree hemorrhoids                           K62.1, Rectal polyp                           K63.5, Polyp of colon                           K57.30, Diverticulosis of large intestine without                            perforation or abscess without bleeding CPT copyright 2019 American Medical Association. All rights reserved. The codes documented in this report are preliminary and upon coder review may  be revised to meet current compliance requirements. Cristopher Estimable. Consetta Cosner, MD Norvel Richards, MD 06/21/2021 12:56:21 PM This report has been signed electronically. Number of Addenda: 0

## 2021-06-23 LAB — SURGICAL PATHOLOGY

## 2021-06-24 ENCOUNTER — Encounter: Payer: Self-pay | Admitting: Internal Medicine

## 2021-06-26 ENCOUNTER — Encounter (HOSPITAL_COMMUNITY): Payer: Self-pay | Admitting: Internal Medicine

## 2021-07-02 DIAGNOSIS — E119 Type 2 diabetes mellitus without complications: Secondary | ICD-10-CM | POA: Diagnosis not present

## 2021-07-02 DIAGNOSIS — I10 Essential (primary) hypertension: Secondary | ICD-10-CM | POA: Diagnosis not present

## 2021-07-24 DIAGNOSIS — Z0001 Encounter for general adult medical examination with abnormal findings: Secondary | ICD-10-CM | POA: Diagnosis not present

## 2021-07-24 DIAGNOSIS — E119 Type 2 diabetes mellitus without complications: Secondary | ICD-10-CM | POA: Diagnosis not present

## 2021-07-24 DIAGNOSIS — E039 Hypothyroidism, unspecified: Secondary | ICD-10-CM | POA: Diagnosis not present

## 2021-07-24 DIAGNOSIS — I10 Essential (primary) hypertension: Secondary | ICD-10-CM | POA: Diagnosis not present

## 2021-07-24 DIAGNOSIS — Z1389 Encounter for screening for other disorder: Secondary | ICD-10-CM | POA: Diagnosis not present

## 2021-07-24 DIAGNOSIS — H40023 Open angle with borderline findings, high risk, bilateral: Secondary | ICD-10-CM | POA: Diagnosis not present

## 2021-07-24 DIAGNOSIS — Z1331 Encounter for screening for depression: Secondary | ICD-10-CM | POA: Diagnosis not present

## 2021-07-25 DIAGNOSIS — I10 Essential (primary) hypertension: Secondary | ICD-10-CM | POA: Diagnosis not present

## 2021-07-25 DIAGNOSIS — Z0001 Encounter for general adult medical examination with abnormal findings: Secondary | ICD-10-CM | POA: Diagnosis not present

## 2021-07-25 DIAGNOSIS — E039 Hypothyroidism, unspecified: Secondary | ICD-10-CM | POA: Diagnosis not present

## 2021-07-25 DIAGNOSIS — E119 Type 2 diabetes mellitus without complications: Secondary | ICD-10-CM | POA: Diagnosis not present

## 2021-07-25 DIAGNOSIS — E785 Hyperlipidemia, unspecified: Secondary | ICD-10-CM | POA: Diagnosis not present

## 2021-07-25 LAB — BASIC METABOLIC PANEL
BUN: 21 (ref 4–21)
CO2: 30 — AB (ref 13–22)
Chloride: 100 (ref 99–108)
Creatinine: 0.9 (ref 0.5–1.1)
Glucose: 108
Potassium: 3.8 mEq/L (ref 3.5–5.1)
Sodium: 140 (ref 137–147)

## 2021-07-25 LAB — TSH: TSH: 2.68 (ref 0.41–5.90)

## 2021-07-25 LAB — COMPREHENSIVE METABOLIC PANEL
Albumin: 4.2 (ref 3.5–5.0)
Calcium: 9.8 (ref 8.7–10.7)
Globulin: 3.4

## 2021-07-25 LAB — HEPATIC FUNCTION PANEL
Alkaline Phosphatase: 73 (ref 25–125)
Bilirubin, Total: 0.5

## 2021-07-25 LAB — HEMOGLOBIN A1C: Hemoglobin A1C: 6.2

## 2021-07-26 LAB — LIPID PANEL
Cholesterol: 207 — AB (ref 0–200)
HDL: 93 — AB (ref 35–70)
LDL Cholesterol: 94
Triglycerides: 107 (ref 40–160)

## 2021-08-24 DIAGNOSIS — I10 Essential (primary) hypertension: Secondary | ICD-10-CM | POA: Diagnosis not present

## 2021-08-24 DIAGNOSIS — E119 Type 2 diabetes mellitus without complications: Secondary | ICD-10-CM | POA: Diagnosis not present

## 2021-09-04 DIAGNOSIS — H40022 Open angle with borderline findings, high risk, left eye: Secondary | ICD-10-CM | POA: Diagnosis not present

## 2021-09-24 DIAGNOSIS — I10 Essential (primary) hypertension: Secondary | ICD-10-CM | POA: Diagnosis not present

## 2021-09-24 DIAGNOSIS — E119 Type 2 diabetes mellitus without complications: Secondary | ICD-10-CM | POA: Diagnosis not present

## 2021-10-22 DIAGNOSIS — I10 Essential (primary) hypertension: Secondary | ICD-10-CM | POA: Diagnosis not present

## 2021-10-22 DIAGNOSIS — E119 Type 2 diabetes mellitus without complications: Secondary | ICD-10-CM | POA: Diagnosis not present

## 2021-11-22 DIAGNOSIS — E119 Type 2 diabetes mellitus without complications: Secondary | ICD-10-CM | POA: Diagnosis not present

## 2021-11-22 DIAGNOSIS — I10 Essential (primary) hypertension: Secondary | ICD-10-CM | POA: Diagnosis not present

## 2021-11-28 DIAGNOSIS — E890 Postprocedural hypothyroidism: Secondary | ICD-10-CM | POA: Diagnosis not present

## 2021-11-29 LAB — T4, FREE: Free T4: 1.61 ng/dL (ref 0.82–1.77)

## 2021-11-29 LAB — TSH: TSH: 1.6 u[IU]/mL (ref 0.450–4.500)

## 2021-12-05 ENCOUNTER — Ambulatory Visit (INDEPENDENT_AMBULATORY_CARE_PROVIDER_SITE_OTHER): Payer: Medicare PPO | Admitting: "Endocrinology

## 2021-12-05 ENCOUNTER — Encounter: Payer: Self-pay | Admitting: "Endocrinology

## 2021-12-05 VITALS — BP 126/78 | HR 68 | Ht 65.0 in | Wt 247.6 lb

## 2021-12-05 DIAGNOSIS — E782 Mixed hyperlipidemia: Secondary | ICD-10-CM | POA: Diagnosis not present

## 2021-12-05 DIAGNOSIS — E119 Type 2 diabetes mellitus without complications: Secondary | ICD-10-CM | POA: Diagnosis not present

## 2021-12-05 DIAGNOSIS — E890 Postprocedural hypothyroidism: Secondary | ICD-10-CM

## 2021-12-05 LAB — POCT GLYCOSYLATED HEMOGLOBIN (HGB A1C): HbA1c, POC (controlled diabetic range): 6 % (ref 0.0–7.0)

## 2021-12-05 NOTE — Progress Notes (Signed)
?               12/05/2021 ? ?                 ?Endocrinology follow-up note ? ?Subjective:  ? ? Patient ID: Cheyenne Benson, female    DOB: 1945-11-12, PCP Carrolyn Meiers, MD ? ? ?Past Medical History:  ?Diagnosis Date  ? Arthritis   ? Asthma   ? Diabetes mellitus without complication ([place])   ? Dysrhythmia   ? "used to speed up a little-when my thyroid wa snot right, I'm on Metorpoolol no- no problem"  ? Hypothyroidism   ? ?Past Surgical History:  ?Procedure Laterality Date  ? 25 GAUGE PARS PLANA VITRECTOMY WITH 20 GAUGE MVR PORT FOR MACULAR HOLE Right 01/22/2013  ? Procedure: 62 GAUGE PARS PLANA VITRECTOMY WITH 20 GAUGE MVR PORT FOR MACULAR HOLE RIGHT EYE ;  Surgeon: Hayden Pedro, MD;  Location: [place];  Service: Ophthalmology;  Laterality: Right;  ? ABDOMINAL HYSTERECTOMY  1998  ? AXILLARY SURGERY Right   ? removes lump and sweet glands  ? buninectom    ? BUNIONECTOMY Bilateral   ? COLONOSCOPY N/A 06/06/2016  ? Procedure: COLONOSCOPY;  Surgeon: Daneil Dolin, MD;  [facility];  Service: Endoscopy;  Laterality: N/A;  8:30 AM  ? COLONOSCOPY N/A 06/21/2021  ? Procedure: COLONOSCOPY;  Surgeon: Daneil Dolin, MD;  [facility];  Service: Endoscopy;  Laterality: N/A;  1:00  ? CYSTECTOMY    ? behind neck--- no anesthesia  ? DG STYLOID PROCESS / TEMPORAL BONES    ? EYE SURGERY Bilateral 4/20\14  ? cataract  ? GAS/FLUID EXCHANGE Right 01/22/2013  ? Procedure: GAS/FLUID EXCHANGE;  Surgeon: Hayden Pedro, MD;  Location: [place];  Service: Ophthalmology;  Laterality: Right;  [id number]  ? LASER PHOTO ABLATION Right 01/22/2013  ? Procedure: LASER PHOTO ABLATION;  Surgeon: Hayden Pedro, MD;  Location: [place];  Service: Ophthalmology;  Laterality: Right;  Headscope  ? MEMBRANE PEEL Right 01/22/2013  ? Procedure: MEMBRANE PEEL;  Surgeon: Hayden Pedro, MD;  Location: [place];  Service: Ophthalmology;  Laterality: Right;  ? POLYPECTOMY  06/21/2021  ? Procedure: POLYPECTOMY;  Surgeon: Daneil Dolin, MD;   [facility];  Service: Endoscopy;;  ? SERUM PATCH Right 01/22/2013  ? Procedure: SERUM PATCH;  Surgeon: Hayden Pedro, MD;  Location: [place];  Service: Ophthalmology;  Laterality: Right;  ? SKIN GRAFT Left   ? after axillary surgery  ? TOE SURGERY    ? left bones removed from 4 and 5 toe, Right foot 5th toe bone removed  ? ?Social History  ? ?Socioeconomic History  ? Marital status: Married  ?  Spouse name: Not on file  ? Number of children: Not on file  ? Years of education: Not on file  ? Highest education level: Not on file  ?Occupational History  ? Not on file  ?Tobacco Use  ? Smoking status: Former  ?  Years: 2.00  ?  Types: Cigarettes  ? Smokeless tobacco: Never  ? Tobacco comments:  ?  maybe smoked a pack a month  ?Vaping Use  ? Vaping Use: Never used  ?Substance and Sexual Activity  ? Alcohol use: No  ? Drug use: No  ? Sexual activity: Not on file  ?Other Topics Concern  ? Not on file  ?Social History Narrative  ? Not on file  ? ?Social Determinants of  Health  ? ?Financial Resource Strain: Not on file  ?Food Insecurity: Not on file  ?Transportation Needs: Not on file  ?Physical Activity: Not on file  ?Stress: Not on file  ?Social Connections: Not on file  ? ?Outpatient Encounter Medications as of 12/05/2021  ?Medication Sig  ? ADVAIR DISKUS 250-50 MCG/DOSE AEPB Inhale 2 puffs into the lungs daily.  ? albuterol (VENTOLIN HFA) 108 (90 Base) MCG/ACT inhaler Inhale 2 puffs into the lungs every 6 (six) hours as needed for wheezing or shortness of breath.  ? amLODipine (NORVASC) 5 MG tablet Take 5 mg by mouth daily.  ? aspirin EC 81 MG tablet Take 81 mg by mouth daily.  ? Cholecalciferol (VITAMIN D3) 125 MCG (5000 UT) TABS Take 5,000 Units by mouth at bedtime.  ? COD LIVER OIL PO Take 1 capsule by mouth at bedtime.  ? Diphenhydramine-APAP (PERCOGESIC) 12.5-325 MG TABS Take 1 tablet by mouth daily as needed (mild pain).  ? Flaxseed, Linseed, (FLAX SEED OIL PO) Take 1 capsule by mouth at bedtime.  ?  hydrochlorothiazide (HYDRODIURIL) 25 MG tablet Take 25 mg by mouth daily.   ? ketotifen (ZADITOR) 0.025 % ophthalmic solution Place 1 drop into both eyes 2 (two) times daily.  ? levothyroxine (EUTHYROX) 112 MCG tablet Take 1 tablet (112 mcg total) by mouth daily before breakfast.  ? Menatetrenone (VITAMIN K2) 100 MCG TABS Take 100 mcg by mouth at bedtime.  ? metFORMIN (GLUCOPHAGE) 500 MG tablet Take 1 tablet (500 mg total) by mouth daily.  ? metoprolol tartrate (LOPRESSOR) 50 MG tablet Take 50 mg by mouth daily.   ? Multiple Vitamin (MULTIVITAMIN WITH MINERALS) TABS Take 1 tablet by mouth daily.  ? simvastatin (ZOCOR) 20 MG tablet Take 20 mg by mouth at bedtime.  ? ?No facility-administered encounter medications on file as of 12/05/2021.  ? ?ALLERGIES: ?Allergies  ?Allergen Reactions  ? Lisinopril Anaphylaxis and Swelling  ? Adhesive [Tape]   ?  redness  ? Codeine Nausea Only  ?  Upset stomach   ? Peanut-Containing Drug Products Swelling  ?  Tingling and swelling of the mouth.  ? Shrimp [Shellfish Allergy] Swelling  ?  Tingling and swelling of the mouth.  ? ?VACCINATION STATUS: ?Immunization History  ?Administered Date(s) Administered  ? Influenza-Unspecified 04/13/2018  ? Moderna Sars-Covid-2 Vaccination 09/24/2019, 10/26/2019  ? ? ?HPI ? ?76--year-old female with medical history of  Graves' disease status post radioactive iodine ablation at approximate age of 76 years.   ? ? She is being seen in follow-up for RAI induced hypothyroidism, type 2 diabetes, hyperlipidemia, hypertension.   She is currently on levothyroxine 112 mcg p.o. daily before breakfast.  Her previsit labs are consistent with slight over replacement.  She presents with steady weight.  She remains on low-dose metformin 500 mg p.o. daily at breakfast.  Her point-of-care A1c is unchanged at 6%.   ?   ?- She has been overweight/obese most of her adult life. . ? ?- She has no new complaints today. She denies cold/heat intolerance, palpitations, tremors,  chest pain, shortness of breath. ?- She has family history of hypothyroidism in her mother.   ?  ? ?Review of Systems  ? ?Limited as above. ? ?Objective:  ?  ?BP 126/78   Pulse 68   Ht '5\' 5"'$  (1.651 m)   Wt 247 lb 9.6 oz (112.3 kg)   BMI 41.20 kg/m?   ?Wt Readings from Last 3 Encounters:  ?12/05/21 247 lb 9.6 oz (112.3 kg)  ?  06/21/21 250 lb (113.4 kg)  ?06/05/21 247 lb 12.8 oz (112.4 kg)  ? ? ? ? ?Recent Results (from the past 2160 hour(s))  ?TSH     Status: None  ? Collection Time: 11/28/21  9:46 AM  ?Result Value Ref Range  ? TSH 1.600 0.450 - 4.500 uIU/mL  ?T4, free     Status: None  ? Collection Time: 11/28/21  9:46 AM  ?Result Value Ref Range  ? Free T4 1.61 0.82 - 1.77 ng/dL  ?HgB A1c     Status: None  ? Collection Time: 12/05/21 10:44 AM  ?Result Value Ref Range  ? Hemoglobin A1C    ? HbA1c POC (<> result, manual entry)    ? HbA1c, POC (prediabetic range)    ? HbA1c, POC (controlled diabetic range) 6.0 0.0 - 7.0 %  ? ?Lipid Panel  ?   ?Component Value Date/Time  ? CHOL 179 05/29/2021 1000  ? TRIG 81 05/29/2021 1000  ? HDL 90 05/29/2021 1000  ? CHOLHDL 2.0 05/29/2021 1000  ? CHOLHDL 2.3 11/17/2019 0738  ? [place] 74 05/29/2021 1000  ? [place] 89 11/17/2019 0738  ? LABVLDL 15 05/29/2021 1000  ? ? ?  Latest Ref Rng & Units 03/16/2019  ?  7:35 AM 02/20/2018  ?  9:14 AM 04/19/2017  ? 12:00 AM  ?CMP  ?Glucose 65 - 99 mg/dL 111   100     ?BUN 7 - 25 mg/dL '20   19   14       '$ ?Creatinine 0.60 - 0.93 mg/dL 0.78   0.79   0.9       ?Sodium 135 - 146 mmol/L 141   143     ?Potassium 3.5 - 5.3 mmol/L 3.9   3.7     ?Chloride 98 - 110 mmol/L 101   106     ?CO2 20 - 32 mmol/L 29   30     ?Calcium 8.6 - 10.4 mg/dL 9.9   9.3     ?Total Protein 6.1 - 8.1 g/dL 7.4   7.1     ?Total Bilirubin 0.2 - 1.2 mg/dL 0.4   0.4     ?AST 10 - 35 U/L 13   16     ?ALT 6 - 29 U/L 12   13     ?  ? This result is from an external source.  ? ? ? ? ?Assessment & Plan:  ? ?1. 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 ?-Her previsit thyroid function tests are  consistent with appropriate replacement.  She is advised to continue levothyroxine 112 mcg p.o. daily before breakfast.   ? ? ? - We discussed about the correct intake of her thyroid hormone, on empty stomach at fa

## 2021-12-05 NOTE — Patient Instructions (Signed)

## 2021-12-18 ENCOUNTER — Other Ambulatory Visit: Payer: Self-pay | Admitting: "Endocrinology

## 2021-12-18 DIAGNOSIS — E890 Postprocedural hypothyroidism: Secondary | ICD-10-CM

## 2021-12-22 DIAGNOSIS — E119 Type 2 diabetes mellitus without complications: Secondary | ICD-10-CM | POA: Diagnosis not present

## 2021-12-22 DIAGNOSIS — I10 Essential (primary) hypertension: Secondary | ICD-10-CM | POA: Diagnosis not present

## 2022-01-16 DIAGNOSIS — E039 Hypothyroidism, unspecified: Secondary | ICD-10-CM | POA: Diagnosis not present

## 2022-01-16 DIAGNOSIS — E119 Type 2 diabetes mellitus without complications: Secondary | ICD-10-CM | POA: Diagnosis not present

## 2022-01-16 DIAGNOSIS — J4540 Moderate persistent asthma, uncomplicated: Secondary | ICD-10-CM | POA: Diagnosis not present

## 2022-01-16 DIAGNOSIS — I10 Essential (primary) hypertension: Secondary | ICD-10-CM | POA: Diagnosis not present

## 2022-02-15 DIAGNOSIS — E119 Type 2 diabetes mellitus without complications: Secondary | ICD-10-CM | POA: Diagnosis not present

## 2022-02-15 DIAGNOSIS — I10 Essential (primary) hypertension: Secondary | ICD-10-CM | POA: Diagnosis not present

## 2022-02-20 DIAGNOSIS — H35373 Puckering of macula, bilateral: Secondary | ICD-10-CM | POA: Diagnosis not present

## 2022-03-18 DIAGNOSIS — I10 Essential (primary) hypertension: Secondary | ICD-10-CM | POA: Diagnosis not present

## 2022-03-18 DIAGNOSIS — E119 Type 2 diabetes mellitus without complications: Secondary | ICD-10-CM | POA: Diagnosis not present

## 2022-04-08 ENCOUNTER — Other Ambulatory Visit: Payer: Self-pay | Admitting: "Endocrinology

## 2022-04-08 DIAGNOSIS — E890 Postprocedural hypothyroidism: Secondary | ICD-10-CM

## 2022-04-18 DIAGNOSIS — E1165 Type 2 diabetes mellitus with hyperglycemia: Secondary | ICD-10-CM | POA: Diagnosis not present

## 2022-04-18 DIAGNOSIS — I10 Essential (primary) hypertension: Secondary | ICD-10-CM | POA: Diagnosis not present

## 2022-05-18 DIAGNOSIS — E1165 Type 2 diabetes mellitus with hyperglycemia: Secondary | ICD-10-CM | POA: Diagnosis not present

## 2022-05-18 DIAGNOSIS — I10 Essential (primary) hypertension: Secondary | ICD-10-CM | POA: Diagnosis not present

## 2022-05-29 ENCOUNTER — Telehealth: Payer: Self-pay | Admitting: "Endocrinology

## 2022-05-29 NOTE — Telephone Encounter (Deleted)
Error

## 2022-05-30 DIAGNOSIS — E782 Mixed hyperlipidemia: Secondary | ICD-10-CM | POA: Diagnosis not present

## 2022-05-30 DIAGNOSIS — E890 Postprocedural hypothyroidism: Secondary | ICD-10-CM | POA: Diagnosis not present

## 2022-05-31 LAB — T4, FREE: Free T4: 1.52 ng/dL (ref 0.82–1.77)

## 2022-05-31 LAB — LIPID PANEL
Chol/HDL Ratio: 2.1 ratio (ref 0.0–4.4)
Cholesterol, Total: 175 mg/dL (ref 100–199)
HDL: 82 mg/dL (ref >39)
LDL Chol Calc (NIH): 77 mg/dL (ref 0–99)
Triglycerides: 89 mg/dL (ref 0–149)
VLDL Cholesterol Cal: 16 mg/dL (ref 5–40)

## 2022-05-31 LAB — TSH: TSH: 1.76 u[IU]/mL (ref 0.450–4.500)

## 2022-06-06 ENCOUNTER — Ambulatory Visit: Payer: Medicare PPO | Admitting: "Endocrinology

## 2022-06-06 ENCOUNTER — Encounter: Payer: Self-pay | Admitting: "Endocrinology

## 2022-06-06 VITALS — BP 114/78 | HR 64 | Ht 65.0 in | Wt 248.8 lb

## 2022-06-06 DIAGNOSIS — E890 Postprocedural hypothyroidism: Secondary | ICD-10-CM | POA: Diagnosis not present

## 2022-06-06 DIAGNOSIS — E119 Type 2 diabetes mellitus without complications: Secondary | ICD-10-CM | POA: Diagnosis not present

## 2022-06-06 DIAGNOSIS — I10 Essential (primary) hypertension: Secondary | ICD-10-CM

## 2022-06-06 DIAGNOSIS — E782 Mixed hyperlipidemia: Secondary | ICD-10-CM

## 2022-06-06 LAB — POCT GLYCOSYLATED HEMOGLOBIN (HGB A1C): HbA1c, POC (controlled diabetic range): 6.1 % (ref 0.0–7.0)

## 2022-06-06 NOTE — Patient Instructions (Signed)
                                     Advice for Weight Management  -For most of us the best way to lose weight is by diet management. Generally speaking, diet management means consuming less calories intentionally which over time brings about progressive weight loss.  This can be achieved more effectively by avoiding ultra processed carbohydrates, processed meats, unhealthy fats.    It is critically important to know your numbers: how much calorie you are consuming and how much calorie you need. More importantly, our carbohydrates sources should be unprocessed naturally occurring  complex starch food items.  It is always important to balance nutrition also by  appropriate intake of proteins (mainly plant-based), healthy fats/oils, plenty of fruits and vegetables.   -The American College of Lifestyle Medicine (ACL M) recommends nutrition derived mostly from Whole Food, Plant Predominant Sources example an apple instead of applesauce or apple pie. Eat Plenty of vegetables, Mushrooms, fruits, Legumes, Whole Grains, Nuts, seeds in lieu of processed meats, processed snacks/pastries red meat, poultry, eggs.  Use only water or unsweetened tea for hydration.  The College also recommends the need to stay away from risky substances including alcohol, smoking; obtaining 7-9 hours of restorative sleep, at least 150 minutes of moderate intensity exercise weekly, importance of healthy social connections, and being mindful of stress and seek help when it is overwhelming.    -Sticking to a routine mealtime to eat 3 meals a day and avoiding unnecessary snacks is shown to have a big role in weight control. Under normal circumstances, the only time we burn stored energy is when we are hungry, so allow  some hunger to take place- hunger means no food between appropriate meal times, only water.  It is not advisable to starve.   -It is better to avoid simple carbohydrates including:  Cakes, Sweet Desserts, Ice Cream, Soda (diet and regular), Sweet Tea, Candies, Chips, Cookies, Store Bought Juices, Alcohol in Excess of  1-2 drinks a day, Lemonade,  Artificial Sweeteners, Doughnuts, Coffee Creamers, "Sugar-free" Products, etc, etc.  This is not a complete list.....    -Consulting with certified diabetes educators is proven to provide you with the most accurate and current information on diet.  Also, you may be  interested in discussing diet options/exchanges , we can schedule a visit with Cheyenne Benson, RDN, CDE for individualized nutrition education.  -Exercise: If you are able: 30 -60 minutes a day ,4 days a week, or 150 minutes of moderate intensity exercise weekly.    The longer the better if tolerated.  Combine stretch, strength, and aerobic activities.  If you were told in the past that you have high risk for cardiovascular diseases, or if you are currently symptomatic, you may seek evaluation by your heart doctor prior to initiating moderate to intense exercise programs.                                  Additional Care Considerations for Diabetes/Prediabetes   -Diabetes  is a chronic disease.  The most important care consideration is regular follow-up with your diabetes care provider with the goal being avoiding or delaying its complications and to take advantage of advances in medications and technology.  If appropriate actions are taken early enough, type 2 diabetes can even be   reversed.  Seek information from the right source.  - Whole Food, Plant Predominant Nutrition is highly recommended: Eat Plenty of vegetables, Mushrooms, fruits, Legumes, Whole Grains, Nuts, seeds in lieu of processed meats, processed snacks/pastries red meat, poultry, eggs as recommended by American College of  Lifestyle Medicine (ACLM).  -Type 2 diabetes is known to coexist with other important comorbidities such as high blood pressure and high cholesterol.  It is critical to control not only the  diabetes but also the high blood pressure and high cholesterol to minimize and delay the risk of complications including coronary artery disease, stroke, amputations, blindness, etc.  The good news is that this diet recommendation for type 2 diabetes is also very helpful for managing high cholesterol and high blood blood pressure.  - Studies showed that people with diabetes will benefit from a class of medications known as ACE inhibitors and statins.  Unless there are specific reasons not to be on these medications, the standard of care is to consider getting one from these groups of medications at an optimal doses.  These medications are generally considered safe and proven to help protect the heart and the kidneys.    - People with diabetes are encouraged to initiate and maintain regular follow-up with eye doctors, foot doctors, dentists , and if necessary heart and kidney doctors.     - It is highly recommended that people with diabetes quit smoking or stay away from smoking, and get yearly  flu vaccine and pneumonia vaccine at least every 5 years.  See above for additional recommendations on exercise, sleep, stress management , and healthy social connections.      

## 2022-06-06 NOTE — Progress Notes (Signed)
06/06/2022                   Endocrinology follow-up note  Subjective:    Patient ID: Cheyenne Benson, female    DOB: [birth year], PCP Carrolyn Meiers, MD   Past Medical History:  Diagnosis Date   Arthritis    Asthma    Diabetes mellitus without complication ([place])    Dysrhythmia    "used to speed up a little-when my thyroid wa snot right, I'm on Metorpoolol no- no problem"   Hypothyroidism    Past Surgical History:  Procedure Laterality Date   25 GAUGE PARS PLANA VITRECTOMY WITH 20 GAUGE MVR PORT FOR MACULAR HOLE Right 01/22/2013   Procedure: 25 GAUGE PARS PLANA VITRECTOMY WITH 20 GAUGE MVR PORT FOR MACULAR HOLE RIGHT EYE ;  Surgeon: Hayden Pedro, MD;  Location: [place];  Service: Ophthalmology;  Laterality: Right;   ABDOMINAL HYSTERECTOMY  1998   AXILLARY SURGERY Right    removes lump and sweet glands   buninectom     BUNIONECTOMY Bilateral    COLONOSCOPY N/A 06/06/2016   Procedure: COLONOSCOPY;  Surgeon: Daneil Dolin, MD;  [facility];  Service: Endoscopy;  Laterality: N/A;  8:30 AM   COLONOSCOPY N/A 06/21/2021   Procedure: COLONOSCOPY;  Surgeon: Daneil Dolin, MD;  [facility];  Service: Endoscopy;  Laterality: N/A;  1:00   CYSTECTOMY     behind neck--- no anesthesia   DG STYLOID PROCESS / TEMPORAL BONES     EYE SURGERY Bilateral 4/20\14   cataract   GAS/FLUID EXCHANGE Right 01/22/2013   Procedure: GAS/FLUID EXCHANGE;  Surgeon: Hayden Pedro, MD;  Location: [place];  Service: Ophthalmology;  Laterality: Right;  C3F8   LASER PHOTO ABLATION Right 01/22/2013   Procedure: LASER PHOTO ABLATION;  Surgeon: Hayden Pedro, MD;  Location: [place];  Service: Ophthalmology;  Laterality: Right;  Headscope   MEMBRANE PEEL Right 01/22/2013   Procedure: MEMBRANE PEEL;  Surgeon: Hayden Pedro, MD;  Location: [place];  Service: Ophthalmology;  Laterality: Right;   POLYPECTOMY  06/21/2021   Procedure: POLYPECTOMY;  Surgeon: Daneil Dolin, MD;   [facility];  Service: Endoscopy;;   SERUM PATCH Right 01/22/2013   Procedure: SERUM PATCH;  Surgeon: Hayden Pedro, MD;  Location: [place];  Service: Ophthalmology;  Laterality: Right;   SKIN GRAFT Left    after axillary surgery   TOE SURGERY     left bones removed from 4 and 5 toe, Right foot 5th toe bone removed   Social History   Socioeconomic History   Marital status: Married    Spouse name: Not on file   Number of children: Not on file   Years of education: Not on file   Highest education level: Not on file  Occupational History   Not on file  Tobacco Use   Smoking status: Former    Years: 2.00    Types: Cigarettes   Smokeless tobacco: Never   Tobacco comments:    maybe smoked a pack a month  Vaping Use   Vaping Use: Never used  Substance and Sexual Activity   Alcohol use: No   Drug use: No   Sexual activity: Not on file  Other Topics Concern   Not on file  Social History Narrative   Not on file   Social Determinants of  Health   Financial Resource Strain: Not on file  Food Insecurity: Not on file  Transportation Needs: Not on file  Physical Activity: Not on file  Stress: Not on file  Social Connections: Not on file   Outpatient Encounter Medications as of 06/06/2022  Medication Sig   ADVAIR DISKUS 250-50 MCG/DOSE AEPB Inhale 2 puffs into the lungs daily.   albuterol (VENTOLIN HFA) 108 (90 Base) MCG/ACT inhaler Inhale 2 puffs into the lungs every 6 (six) hours as needed for wheezing or shortness of breath.   amLODipine (NORVASC) 5 MG tablet Take 5 mg by mouth daily.   aspirin EC 81 MG tablet Take 81 mg by mouth daily.   Cholecalciferol (VITAMIN D3) 125 MCG (5000 UT) TABS Take 5,000 Units by mouth at bedtime.   COD LIVER OIL PO Take 1 capsule by mouth at bedtime.   Diphenhydramine-APAP (PERCOGESIC) 12.5-325 MG TABS Take 1 tablet by mouth daily as needed (mild pain).   Flaxseed, Linseed, (FLAX SEED OIL PO) Take 1 capsule by mouth at bedtime.    hydrochlorothiazide (HYDRODIURIL) 25 MG tablet Take 25 mg by mouth daily.    ketotifen (ZADITOR) 0.025 % ophthalmic solution Place 1 drop into both eyes 2 (two) times daily.   levothyroxine (SYNTHROID) 112 MCG tablet TAKE 1 TABLET BY MOUTH ONCE DAILY BEFORE  BREAKFAST   Menatetrenone (VITAMIN K2) 100 MCG TABS Take 100 mcg by mouth at bedtime.   metFORMIN (GLUCOPHAGE) 500 MG tablet Take 1 tablet (500 mg total) by mouth daily.   metoprolol tartrate (LOPRESSOR) 50 MG tablet Take 50 mg by mouth daily.    Multiple Vitamin (MULTIVITAMIN WITH MINERALS) TABS Take 1 tablet by mouth daily.   simvastatin (ZOCOR) 20 MG tablet Take 20 mg by mouth at bedtime.   No facility-administered encounter medications on file as of 06/06/2022.   ALLERGIES: Allergies  Allergen Reactions   Lisinopril Anaphylaxis and Swelling   Adhesive [Tape]     redness   Codeine Nausea Only    Upset stomach    Peanut-Containing Drug Products Swelling    Tingling and swelling of the mouth.   Shrimp [Shellfish Allergy] Swelling    Tingling and swelling of the mouth.   VACCINATION STATUS: Immunization History  Administered Date(s) Administered   Influenza-Unspecified 04/13/2018   Moderna Sars-Covid-2 Vaccination 09/24/2019, 10/26/2019    HPI  76--year-old female with medical history of  Graves' disease status post radioactive iodine ablation at approximate age of 37 years.     She is being seen in follow-up for RAI induced hypothyroidism, type 2 diabetes, hyperlipidemia, hypertension.   She is currently on levothyroxine 112 mcg p.o. daily before breakfast.  Her previsit labs are consistent with slight over replacement.  She presents with steady weight.  She remains on low-dose metformin 500 mg p.o. daily at breakfast.   Her point-of-care A1c remains stable at 6.1%.      - She has been overweight/obese most of her adult life. .  - She has no new complaints today. She denies cold/heat intolerance, palpitations, tremors,  chest pain, shortness of breath. - She has family history of hypothyroidism in her mother.      Review of Systems   Limited as above.  Objective:    BP 114/78   Pulse 64   Ht '5\' 5"'$  (1.651 m)   Wt 248 lb 12.8 oz (112.9 kg)   BMI 41.40 kg/m   Wt Readings from Last 3 Encounters:  06/06/22 248 lb 12.8 oz (112.9 kg)  12/05/21 247 lb 9.6 oz (112.3 kg)  06/21/21 250 lb (113.4 kg)      Recent Results (from the past 2160 hour(s))  TSH     Status: None   Collection Time: 05/30/22  8:35 AM  Result Value Ref Range   TSH 1.760 0.450 - 4.500 uIU/mL  T4, free     Status: None   Collection Time: 05/30/22  8:35 AM  Result Value Ref Range   Free T4 1.52 0.82 - 1.77 ng/dL  Lipid panel     Status: None   Collection Time: 05/30/22  8:35 AM  Result Value Ref Range   Cholesterol, Total 175 100 - 199 mg/dL   Triglycerides 89 0 - 149 mg/dL   HDL 82 >39 mg/dL   VLDL Cholesterol Cal 16 5 - 40 mg/dL   LDL Chol Calc (NIH) 77 0 - 99 mg/dL   Chol/HDL Ratio 2.1 0.0 - 4.4 ratio    Comment:                                   T. Chol/HDL Ratio                                             Men  Women                               1/2 Avg.Risk  3.4    3.3                                   Avg.Risk  5.0    4.4                                2X Avg.Risk  9.6    7.1                                3X Avg.Risk 23.4   11.0   HgB A1c     Status: None   Collection Time: 06/06/22  8:40 AM  Result Value Ref Range   Hemoglobin A1C     HbA1c POC (<> result, manual entry)     HbA1c, POC (prediabetic range)     HbA1c, POC (controlled diabetic range) 6.1 0.0 - 7.0 %   Lipid Panel     Component Value Date/Time   CHOL 175 05/30/2022 0835   TRIG 89 05/30/2022 0835   HDL 82 05/30/2022 0835   CHOLHDL 2.1 05/30/2022 0835   CHOLHDL 2.3 11/17/2019 0738   LDLCALC 77 05/30/2022 0835   LDLCALC 89 11/17/2019 0738   LABVLDL 16 05/30/2022 0835      Latest Ref Rng & Units 07/25/2021   12:00 AM 03/16/2019    7:35 AM  02/20/2018    9:14 AM  CMP  Glucose 65 - 99 mg/dL  111  100   BUN 4 - '21 21     20  19   '$ Creatinine 0.5 - 1.1 0.9     0.78  0.79   Sodium 137 - 147 140  141  143   Potassium 3.5 - 5.1 mEq/L 3.8     3.9  3.7   Chloride 99 - 108 100     101  106   CO2 13 - '22 30     29  30   '$ Calcium 8.7 - 10.7 9.8     9.9  9.3   Total Protein 6.1 - 8.1 g/dL  7.4  7.1   Total Bilirubin 0.2 - 1.2 mg/dL  0.4  0.4   Alkaline Phos 25 - 125 73        AST 10 - 35 U/L  13  16   ALT 6 - 29 U/L  12  13      This result is from an external source.      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  She is advised to continue levothyroxine 112 mcg p.o. daily before breakfast.     - We discussed about the correct intake of her thyroid hormone, on empty stomach at fasting, with water, separated by at least 30 minutes from breakfast and other medications,  and separated by more than 4 hours from calcium, iron, multivitamins, acid reflux medications (PPIs). -Patient is made aware of the fact that thyroid hormone replacement is needed for life, dose to be adjusted by periodic monitoring of thyroid function tests.   2. Diabetes mellitus without complication (HCC) -Her point-of-care A1c is 6.1%.  She has responded to low-dose metformin, advised to continue metformin 500 mg p.o. daily at breakfast.    - she acknowledges that there is a room for improvement in her food and drink choices. - Suggestion is made for her to avoid simple carbohydrates  from her diet including Cakes, Sweet Desserts, Ice Cream, Soda (diet and regular), Sweet Tea, Candies, Chips, Cookies, Store Bought Juices, Alcohol , Artificial Sweeteners,  Coffee Creamer, and "Sugar-free" Products, Lemonade. This will help patient to have more stable blood glucose profile and potentially avoid unintended weight gain.   Food plant-based diet was discussed with  her.  3. Essential hypertension-controlled to target. Her blood pressure is control to target.  She is advised to continue her current blood pressure medications including amlodipine 5 mg daily, hydrochlorothiazide 25 mg daily, metoprolol 50 mg p.o. daily.   She was taken off of hydralazine in the interval.  4. Hyperlipidemia -Her most recent lipid panel from April 2021 showed controlled LDL of 77.  She is benefiting from the simvastatin, advised to continue.  Side effects and precautions discussed with her.   - I advised patient to maintain close follow up with Carrolyn Meiers, MD for primary care needs.   I spent 21 minutes in the care of the patient today including review of labs from Thyroid Function, CMP, and other relevant labs ; imaging/biopsy records (current and previous including abstractions from other facilities); face-to-face time discussing  her lab results and symptoms, medications doses, her options of short and long term treatment based on the latest standards of care / guidelines;   and documenting the encounter.  Clearance Coots  participated in the discussions, expressed understanding, and voiced agreement with the above plans.  All questions were answered to her satisfaction. she is encouraged to contact clinic should she have any questions or concerns prior to her return visit.    Follow up plan: No follow-ups on file.  Glade Lloyd, MD Phone: [phone]  Fax:  [phone]  This note was partially dictated with voice recognition software. Similar sounding words can be transcribed inadequately or may not  be corrected upon review.  06/06/2022, 8:49 AM

## 2022-06-18 DIAGNOSIS — E785 Hyperlipidemia, unspecified: Secondary | ICD-10-CM | POA: Diagnosis not present

## 2022-06-18 DIAGNOSIS — I10 Essential (primary) hypertension: Secondary | ICD-10-CM | POA: Diagnosis not present

## 2022-07-15 ENCOUNTER — Other Ambulatory Visit: Payer: Self-pay | Admitting: "Endocrinology

## 2022-07-15 DIAGNOSIS — E890 Postprocedural hypothyroidism: Secondary | ICD-10-CM

## 2022-07-17 DIAGNOSIS — Z1389 Encounter for screening for other disorder: Secondary | ICD-10-CM | POA: Diagnosis not present

## 2022-07-17 DIAGNOSIS — Z1331 Encounter for screening for depression: Secondary | ICD-10-CM | POA: Diagnosis not present

## 2022-07-17 DIAGNOSIS — I10 Essential (primary) hypertension: Secondary | ICD-10-CM | POA: Diagnosis not present

## 2022-07-17 DIAGNOSIS — E039 Hypothyroidism, unspecified: Secondary | ICD-10-CM | POA: Diagnosis not present

## 2022-07-17 DIAGNOSIS — J3089 Other allergic rhinitis: Secondary | ICD-10-CM | POA: Diagnosis not present

## 2022-07-17 DIAGNOSIS — E119 Type 2 diabetes mellitus without complications: Secondary | ICD-10-CM | POA: Diagnosis not present

## 2022-07-17 DIAGNOSIS — E785 Hyperlipidemia, unspecified: Secondary | ICD-10-CM | POA: Diagnosis not present

## 2022-07-17 DIAGNOSIS — J4540 Moderate persistent asthma, uncomplicated: Secondary | ICD-10-CM | POA: Diagnosis not present

## 2022-07-17 DIAGNOSIS — E1165 Type 2 diabetes mellitus with hyperglycemia: Secondary | ICD-10-CM | POA: Diagnosis not present

## 2022-07-17 DIAGNOSIS — E559 Vitamin D deficiency, unspecified: Secondary | ICD-10-CM | POA: Diagnosis not present

## 2022-07-17 DIAGNOSIS — Z0001 Encounter for general adult medical examination with abnormal findings: Secondary | ICD-10-CM | POA: Diagnosis not present

## 2022-07-18 ENCOUNTER — Other Ambulatory Visit (HOSPITAL_COMMUNITY): Payer: Self-pay | Admitting: Internal Medicine

## 2022-07-18 DIAGNOSIS — Z1231 Encounter for screening mammogram for malignant neoplasm of breast: Secondary | ICD-10-CM

## 2022-07-25 ENCOUNTER — Ambulatory Visit (HOSPITAL_COMMUNITY)
Admission: RE | Admit: 2022-07-25 | Discharge: 2022-07-25 | Disposition: A | Discharge status: Home / Self Care | Payer: Medicare PPO | Source: Ambulatory Visit | Attending: Internal Medicine | Admitting: Internal Medicine

## 2022-07-25 DIAGNOSIS — Z1231 Encounter for screening mammogram for malignant neoplasm of breast: Secondary | ICD-10-CM | POA: Diagnosis not present

## 2022-08-17 DIAGNOSIS — I10 Essential (primary) hypertension: Secondary | ICD-10-CM | POA: Diagnosis not present

## 2022-08-17 DIAGNOSIS — E1165 Type 2 diabetes mellitus with hyperglycemia: Secondary | ICD-10-CM | POA: Diagnosis not present

## 2022-08-30 DIAGNOSIS — H40023 Open angle with borderline findings, high risk, bilateral: Secondary | ICD-10-CM | POA: Diagnosis not present

## 2022-09-17 DIAGNOSIS — I10 Essential (primary) hypertension: Secondary | ICD-10-CM | POA: Diagnosis not present

## 2022-09-17 DIAGNOSIS — E1165 Type 2 diabetes mellitus with hyperglycemia: Secondary | ICD-10-CM | POA: Diagnosis not present

## 2022-10-11 ENCOUNTER — Other Ambulatory Visit: Payer: Self-pay | Admitting: "Endocrinology

## 2022-10-11 DIAGNOSIS — E890 Postprocedural hypothyroidism: Secondary | ICD-10-CM

## 2022-10-16 DIAGNOSIS — E1165 Type 2 diabetes mellitus with hyperglycemia: Secondary | ICD-10-CM | POA: Diagnosis not present

## 2022-10-16 DIAGNOSIS — I10 Essential (primary) hypertension: Secondary | ICD-10-CM | POA: Diagnosis not present

## 2022-11-16 DIAGNOSIS — E1165 Type 2 diabetes mellitus with hyperglycemia: Secondary | ICD-10-CM | POA: Diagnosis not present

## 2022-11-16 DIAGNOSIS — I10 Essential (primary) hypertension: Secondary | ICD-10-CM | POA: Diagnosis not present

## 2022-11-29 DIAGNOSIS — E890 Postprocedural hypothyroidism: Secondary | ICD-10-CM | POA: Diagnosis not present

## 2022-11-29 DIAGNOSIS — E119 Type 2 diabetes mellitus without complications: Secondary | ICD-10-CM | POA: Diagnosis not present

## 2022-11-30 LAB — COMPREHENSIVE METABOLIC PANEL
ALT: 17 IU/L (ref 0–32)
AST: 16 IU/L (ref 0–40)
Albumin/Globulin Ratio: 1.3 (ref 1.2–2.2)
Albumin: 3.9 g/dL (ref 3.8–4.8)
Alkaline Phosphatase: 89 IU/L (ref 44–121)
BUN/Creatinine Ratio: 21 (ref 12–28)
BUN: 19 mg/dL (ref 8–27)
Bilirubin Total: 0.2 mg/dL (ref 0.0–1.2)
CO2: 28 mmol/L (ref 20–29)
Calcium: 10.1 mg/dL (ref 8.7–10.3)
Chloride: 98 mmol/L (ref 96–106)
Creatinine, Ser: 0.91 mg/dL (ref 0.57–1.00)
Globulin, Total: 2.9 g/dL (ref 1.5–4.5)
Glucose: 132 mg/dL — ABNORMAL HIGH (ref 70–99)
Potassium: 4.1 mmol/L (ref 3.5–5.2)
Sodium: 141 mmol/L (ref 134–144)
Total Protein: 6.8 g/dL (ref 6.0–8.5)
eGFR: 66 mL/min/{1.73_m2} (ref >59)

## 2022-11-30 LAB — TSH: TSH: 2.28 u[IU]/mL (ref 0.450–4.500)

## 2022-11-30 LAB — T4, FREE: Free T4: 1.33 ng/dL (ref 0.82–1.77)

## 2022-12-06 ENCOUNTER — Encounter: Payer: Self-pay | Admitting: "Endocrinology

## 2022-12-06 ENCOUNTER — Ambulatory Visit: Payer: Medicare PPO | Admitting: "Endocrinology

## 2022-12-06 VITALS — BP 110/78 | HR 60 | Ht 65.0 in | Wt 245.8 lb

## 2022-12-06 DIAGNOSIS — E782 Mixed hyperlipidemia: Secondary | ICD-10-CM | POA: Diagnosis not present

## 2022-12-06 DIAGNOSIS — E890 Postprocedural hypothyroidism: Secondary | ICD-10-CM | POA: Diagnosis not present

## 2022-12-06 DIAGNOSIS — I10 Essential (primary) hypertension: Secondary | ICD-10-CM | POA: Diagnosis not present

## 2022-12-06 DIAGNOSIS — E119 Type 2 diabetes mellitus without complications: Secondary | ICD-10-CM

## 2022-12-06 LAB — POCT GLYCOSYLATED HEMOGLOBIN (HGB A1C): HbA1c, POC (controlled diabetic range): 6.1 % (ref 0.0–7.0)

## 2022-12-06 NOTE — Progress Notes (Signed)
12/06/2022                   Endocrinology follow-up note  Subjective:    Patient ID: Cheyenne Benson, female    DOB: 11-21-1945, PCP Benetta Spar, MD   Past Medical History:  Diagnosis Date   Arthritis    Asthma    Diabetes mellitus without complication    Dysrhythmia    "used to speed up a little-when my thyroid wa snot right, I'm on Metorpoolol no- no problem"   Hypothyroidism    Past Surgical History:  Procedure Laterality Date   25 GAUGE PARS PLANA VITRECTOMY WITH 20 GAUGE MVR PORT FOR MACULAR HOLE Right 01/22/2013   Procedure: 25 GAUGE PARS PLANA VITRECTOMY WITH 20 GAUGE MVR PORT FOR MACULAR HOLE RIGHT EYE ;  Surgeon: Sherrie George, MD;  Location: [facility] OR;  Service: Ophthalmology;  Laterality: Right;   ABDOMINAL HYSTERECTOMY  1998   AXILLARY SURGERY Right    removes lump and sweet glands   buninectom     BUNIONECTOMY Bilateral    COLONOSCOPY N/A 06/06/2016   Procedure: COLONOSCOPY;  Surgeon: Corbin Ade, MD;  [facility];  Service: Endoscopy;  Laterality: N/A;  8:30 AM   COLONOSCOPY N/A 06/21/2021   Procedure: COLONOSCOPY;  Surgeon: Corbin Ade, MD;  [facility];  Service: Endoscopy;  Laterality: N/A;  1:00   CYSTECTOMY     behind neck--- no anesthesia   DG STYLOID PROCESS / TEMPORAL BONES     EYE SURGERY Bilateral 4/20\14   cataract   GAS/FLUID EXCHANGE Right 01/22/2013   Procedure: GAS/FLUID EXCHANGE;  Surgeon: Sherrie George, MD;  Location: [facility] OR;  Service: Ophthalmology;  Laterality: Right;  C3F8   LASER PHOTO ABLATION Right 01/22/2013   Procedure: LASER PHOTO ABLATION;  Surgeon: Sherrie George, MD;  Location: [facility] OR;  Service: Ophthalmology;  Laterality: Right;  Headscope   MEMBRANE PEEL Right 01/22/2013   Procedure: MEMBRANE PEEL;  Surgeon: Sherrie George, MD;  Location: [facility] OR;  Service: Ophthalmology;  Laterality: Right;   POLYPECTOMY  06/21/2021   Procedure: POLYPECTOMY;  Surgeon: Corbin Ade, MD;   [facility];  Service: Endoscopy;;   SERUM PATCH Right 01/22/2013   Procedure: SERUM PATCH;  Surgeon: Sherrie George, MD;  Location[facility] OR;  Service: Ophthalmology;  Laterality: Right;   SKIN GRAFT Left    after axillary surgery   TOE SURGERY     left bones removed from 4 and 5 toe, Right foot 5th toe bone removed   Social History   Socioeconomic History   Marital status: Married    Spouse name: Not on file   Number of children: Not on file   Years of education: Not on file   Highest education level: Not on file  Occupational History   Not on file  Tobacco Use   Smoking status: Former    Years: 2    Types: Cigarettes   Smokeless tobacco: Never   Tobacco comments:    maybe smoked a pack a month  Vaping Use   Vaping Use: Never used  Substance and Sexual Activity   Alcohol use: No   Drug use: No   Sexual activity: Not on file  Other Topics Concern   Not on file  Social History Narrative   Not on file   Social Determinants of Health  Financial Resource Strain: Not on file  Food Insecurity: Not on file  Transportation Needs: Not on file  Physical Activity: Not on file  Stress: Not on file  Social Connections: Not on file   Outpatient Encounter Medications as of 12/06/2022  Medication Sig   BREO ELLIPTA 200-25 MCG/ACT AEPB 1 puff daily.   loratadine (CLARITIN) 10 MG tablet Take 10 mg by mouth daily.   albuterol (VENTOLIN HFA) 108 (90 Base) MCG/ACT inhaler Inhale 2 puffs into the lungs every 6 (six) hours as needed for wheezing or shortness of breath.   amLODipine (NORVASC) 5 MG tablet Take 5 mg by mouth daily.   aspirin EC 81 MG tablet Take 81 mg by mouth daily.   Cholecalciferol (VITAMIN D3) 125 MCG (5000 UT) TABS Take 5,000 Units by mouth at bedtime.   COD LIVER OIL PO Take 1 capsule by mouth at bedtime.   Diphenhydramine-APAP (PERCOGESIC) 12.5-325 MG TABS Take 1 tablet by mouth daily as needed (mild pain).   Flaxseed, Linseed, (FLAX SEED OIL PO) Take 1  capsule by mouth at bedtime.   hydrochlorothiazide (HYDRODIURIL) 25 MG tablet Take 25 mg by mouth daily.    ketotifen (ZADITOR) 0.025 % ophthalmic solution Place 1 drop into both eyes 2 (two) times daily.   levothyroxine (SYNTHROID) 112 MCG tablet TAKE 1 TABLET BY MOUTH ONCE DAILY BEFORE BREAKFAST   Menatetrenone (VITAMIN K2) 100 MCG TABS Take 100 mcg by mouth at bedtime.   metFORMIN (GLUCOPHAGE) 500 MG tablet Take 1 tablet (500 mg total) by mouth daily.   metoprolol tartrate (LOPRESSOR) 50 MG tablet Take 50 mg by mouth daily.    Multiple Vitamin (MULTIVITAMIN WITH MINERALS) TABS Take 1 tablet by mouth daily.   simvastatin (ZOCOR) 20 MG tablet Take 20 mg by mouth at bedtime.   [DISCONTINUED] ADVAIR DISKUS 250-50 MCG/DOSE AEPB Inhale 2 puffs into the lungs daily. (Patient not taking: Reported on 12/06/2022)   No facility-administered encounter medications on file as of 12/06/2022.   ALLERGIES: Allergies  Allergen Reactions   Lisinopril Anaphylaxis and Swelling   Adhesive [Tape]     redness   Codeine Nausea Only    Upset stomach    Peanut-Containing Drug Products Swelling    Tingling and swelling of the mouth.   Shrimp [Shellfish Allergy] Swelling    Tingling and swelling of the mouth.   VACCINATION STATUS: Immunization History  Administered Date(s) Administered   Influenza-Unspecified 04/13/2018   Moderna Sars-Covid-2 Vaccination 09/24/2019, 10/26/2019    HPI  77--year-old female with medical history of  Graves' disease status post radioactive iodine ablation at approximate age of 35 years.     She is being seen in follow-up for RAI induced hypothyroidism, type 2 diabetes, hyperlipidemia, hypertension.     She is currently on levothyroxine 112 mcg p.o. daily before breakfast.  Her previsit labs are consistent with appropriate replacement.  She also remains on low-dose metformin 500 mg p.o. daily at breakfast.  Her point-of-care A1c remains stable at 6.1% today.       - She has  been overweight/obese most of her adult life.  She wishes to achieve some weight loss.  - She has no new complaints today. She denies cold/heat intolerance, palpitations, tremors, chest pain, shortness of breath. - She has family history of hypothyroidism in her mother.      Review of Systems   Limited as above.  Objective:    BP 110/78   Pulse 60   Ht 5\' 5"  (1.651 m)  Wt 245 lb 12.8 oz (111.5 kg)   BMI 40.90 kg/m   Wt Readings from Last 3 Encounters:  12/06/22 245 lb 12.8 oz (111.5 kg)  06/06/22 248 lb 12.8 oz (112.9 kg)  12/05/21 247 lb 9.6 oz (112.3 kg)      Recent Results (from the past 2160 hour(s))  Comprehensive metabolic panel     Status: Abnormal   Collection Time: 11/29/22  8:41 AM  Result Value Ref Range   Glucose 132 (H) 70 - 99 mg/dL   BUN 19 8 - 27 mg/dL   Creatinine, Ser 1.61 0.57 - 1.00 mg/dL   eGFR 66 >09 UE/AVW/0.98   BUN/Creatinine Ratio 21 12 - 28   Sodium 141 134 - 144 mmol/L   Potassium 4.1 3.5 - 5.2 mmol/L   Chloride 98 96 - 106 mmol/L   CO2 28 20 - 29 mmol/L   Calcium 10.1 8.7 - 10.3 mg/dL   Total Protein 6.8 6.0 - 8.5 g/dL   Albumin 3.9 3.8 - 4.8 g/dL   Globulin, Total 2.9 1.5 - 4.5 g/dL   Albumin/Globulin Ratio 1.3 1.2 - 2.2   Bilirubin Total 0.2 0.0 - 1.2 mg/dL   Alkaline Phosphatase 89 44 - 121 IU/L   AST 16 0 - 40 IU/L   ALT 17 0 - 32 IU/L  TSH     Status: None   Collection Time: 11/29/22  8:41 AM  Result Value Ref Range   TSH 2.280 0.450 - 4.500 uIU/mL  T4, free     Status: None   Collection Time: 11/29/22  8:41 AM  Result Value Ref Range   Free T4 1.33 0.82 - 1.77 ng/dL  HgB J1B     Status: None   Collection Time: 12/06/22  9:15 AM  Result Value Ref Range   Hemoglobin A1C     HbA1c POC (<> result, manual entry)     HbA1c, POC (prediabetic range)     HbA1c, POC (controlled diabetic range) 6.1 0.0 - 7.0 %   Lipid Panel     Component Value Date/Time   CHOL 175 05/30/2022 0835   TRIG 89 05/30/2022 0835   HDL 82  05/30/2022 0835   CHOLHDL 2.1 05/30/2022 0835   CHOLHDL 2.3 11/17/2019 0738   LDLCALC 77 05/30/2022 0835   LDLCALC 89 11/17/2019 0738   LABVLDL 16 05/30/2022 0835      Latest Ref Rng & Units 11/29/2022    8:41 AM 07/25/2021   12:00 AM 03/16/2019    7:35 AM  CMP  Glucose 70 - 99 mg/dL 147   829   BUN 8 - 27 mg/dL 19  21     20    Creatinine 0.57 - 1.00 mg/dL 5.62  0.9     1.30   Sodium 134 - 144 mmol/L 141  140     141   Potassium 3.5 - 5.2 mmol/L 4.1  3.8     3.9   Chloride 96 - 106 mmol/L 98  100     101   CO2 20 - 29 mmol/L 28  30     29    Calcium 8.7 - 10.3 mg/dL 86.5  9.8     9.9   Total Protein 6.0 - 8.5 g/dL 6.8   7.4   Total Bilirubin 0.0 - 1.2 mg/dL 0.2   0.4   Alkaline Phos 44 - 121 IU/L 89  73       AST 0 - 40 IU/L 16   13  ALT 0 - 32 IU/L 17   12      This result is from an external source.      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  She is advised to continue levothyroxine 112 mcg p.o. daily before breakfast.    - We discussed about the correct intake of her thyroid hormone, on empty stomach at fasting, with water, separated by at least 30 minutes from breakfast and other medications,  and separated by more than 4 hours from calcium, iron, multivitamins, acid reflux medications (PPIs). -Patient is made aware of the fact that thyroid hormone replacement is needed for life, dose to be adjusted by periodic monitoring of thyroid function tests.  2. Diabetes mellitus without complication (HCC) -Her point-of-care A1c is 6.1%.  She maintains good control of diabetes with low-dose metformin, advised to continue metformin 500 mg p.o. daily at breakfast.   In light of the fact that she wishes to achieve weight loss, and her other evidence of metabolic dysfunction, she is approached for lifestyle medicine.   - she acknowledges that there is a room for improvement in her food and  drink choices. - Suggestion is made for her to avoid simple carbohydrates  from her diet including Cakes, Sweet Desserts, Ice Cream, Soda (diet and regular), Sweet Tea, Candies, Chips, Cookies, Store Bought Juices, Alcohol , Artificial Sweeteners,  Coffee Creamer, and "Sugar-free" Products, Lemonade. This will help patient to have more stable blood glucose profile and potentially avoid unintended weight gain.  The following Lifestyle Medicine recommendations according to American College of Lifestyle Medicine  [facility]) were discussed and and offered to patient and she  agrees to start the journey:  A. Whole Foods, Plant-Based Nutrition comprising of fruits and vegetables, plant-based proteins, whole-grain carbohydrates was discussed in detail with the patient.   A list for source of those nutrients were also provided to the patient.  Patient will use only water or unsweetened tea for hydration. B.  The need to stay away from risky substances including alcohol, smoking; obtaining 7 to 9 hours of restorative sleep, at least 150 minutes of moderate intensity exercise weekly, the importance of healthy social connections,  and stress management techniques were discussed. C.  A full color page of  Calorie density of various food groups per pound showing examples of each food groups was provided to the patient.    3. Essential hypertension-controlled to target. Her blood pressure is control to target.  She is advised to continue her current blood pressure medications including amlodipine 5 mg daily, hydrochlorothiazide 25 mg daily, metoprolol 50 mg p.o. daily.   She was taken off of hydralazine in the interval.  4. Hyperlipidemia -Her most recent lipid panel from April 2021 showed controlled LDL of 77.  She is benefiting from the statin intervention, advised to continue simvastatin 20 mg p.o. nightly.  Side effects and precautions discussed with her.    - I advised patient to maintain close follow up with  Benetta Spar, MD for primary care needs.   I spent  26  minutes in the care of the patient today including review of labs from Thyroid Function, CMP, and other relevant labs ; imaging/biopsy records (current and previous including abstractions from other facilities); face-to-face time discussing  her lab results and symptoms, medications doses, her options of short and long term treatment based on the latest standards of care /  guidelines;   and documenting the encounter.  Rowe Clack  participated in the discussions, expressed understanding, and voiced agreement with the above plans.  All questions were answered to her satisfaction. she is encouraged to contact clinic should she have any questions or concerns prior to her return visit.   Foot exam was normal on December 06, 2022.  Follow up plan: Return in about 6 months (around 06/07/2023) for Fasting Labs  in AM B4 8, Urine MA - NV, A1c -NV.  Marquis Lunch, MD Phone: [phone]  Fax: [phone]  This note was partially dictated with voice recognition software. Similar sounding words can be transcribed inadequately or may not  be corrected upon review.  12/06/2022, 9:32 AM

## 2022-12-06 NOTE — Patient Instructions (Signed)

## 2022-12-16 DIAGNOSIS — I10 Essential (primary) hypertension: Secondary | ICD-10-CM | POA: Diagnosis not present

## 2022-12-16 DIAGNOSIS — E1165 Type 2 diabetes mellitus with hyperglycemia: Secondary | ICD-10-CM | POA: Diagnosis not present

## 2023-01-14 ENCOUNTER — Other Ambulatory Visit: Payer: Self-pay | Admitting: "Endocrinology

## 2023-01-14 DIAGNOSIS — E890 Postprocedural hypothyroidism: Secondary | ICD-10-CM

## 2023-01-15 ENCOUNTER — Other Ambulatory Visit: Payer: Self-pay

## 2023-01-15 DIAGNOSIS — E890 Postprocedural hypothyroidism: Secondary | ICD-10-CM

## 2023-01-15 DIAGNOSIS — I10 Essential (primary) hypertension: Secondary | ICD-10-CM | POA: Diagnosis not present

## 2023-01-15 DIAGNOSIS — E119 Type 2 diabetes mellitus without complications: Secondary | ICD-10-CM | POA: Diagnosis not present

## 2023-01-15 DIAGNOSIS — E785 Hyperlipidemia, unspecified: Secondary | ICD-10-CM | POA: Diagnosis not present

## 2023-01-15 DIAGNOSIS — J4540 Moderate persistent asthma, uncomplicated: Secondary | ICD-10-CM | POA: Diagnosis not present

## 2023-01-15 MED ORDER — LEVOTHYROXINE SODIUM 112 MCG PO TABS: 112.0000 ug | ORAL_TABLET | Freq: Every day | ORAL | 0 refills | Status: AC

## 2023-02-14 DIAGNOSIS — I10 Essential (primary) hypertension: Secondary | ICD-10-CM | POA: Diagnosis not present

## 2023-02-14 DIAGNOSIS — E1165 Type 2 diabetes mellitus with hyperglycemia: Secondary | ICD-10-CM | POA: Diagnosis not present

## 2023-03-04 DIAGNOSIS — H35373 Puckering of macula, bilateral: Secondary | ICD-10-CM | POA: Diagnosis not present

## 2023-03-17 DIAGNOSIS — I10 Essential (primary) hypertension: Secondary | ICD-10-CM | POA: Diagnosis not present

## 2023-03-17 DIAGNOSIS — E1165 Type 2 diabetes mellitus with hyperglycemia: Secondary | ICD-10-CM | POA: Diagnosis not present

## 2023-04-17 DIAGNOSIS — E1165 Type 2 diabetes mellitus with hyperglycemia: Secondary | ICD-10-CM | POA: Diagnosis not present

## 2023-04-17 DIAGNOSIS — I10 Essential (primary) hypertension: Secondary | ICD-10-CM | POA: Diagnosis not present

## 2023-05-17 DIAGNOSIS — E1165 Type 2 diabetes mellitus with hyperglycemia: Secondary | ICD-10-CM | POA: Diagnosis not present

## 2023-05-17 DIAGNOSIS — I10 Essential (primary) hypertension: Secondary | ICD-10-CM | POA: Diagnosis not present

## 2023-05-31 DIAGNOSIS — E782 Mixed hyperlipidemia: Secondary | ICD-10-CM | POA: Diagnosis not present

## 2023-05-31 DIAGNOSIS — E890 Postprocedural hypothyroidism: Secondary | ICD-10-CM | POA: Diagnosis not present

## 2023-05-31 DIAGNOSIS — E119 Type 2 diabetes mellitus without complications: Secondary | ICD-10-CM | POA: Diagnosis not present

## 2023-06-01 LAB — COMPREHENSIVE METABOLIC PANEL
ALT: 16 [IU]/L (ref 0–32)
AST: 13 [IU]/L (ref 0–40)
Albumin: 4.3 g/dL (ref 3.8–4.8)
Alkaline Phosphatase: 81 [IU]/L (ref 44–121)
BUN/Creatinine Ratio: 20 (ref 12–28)
BUN: 18 mg/dL (ref 8–27)
Bilirubin Total: 0.3 mg/dL (ref 0.0–1.2)
CO2: 27 mmol/L (ref 20–29)
Calcium: 10.3 mg/dL (ref 8.7–10.3)
Chloride: 97 mmol/L (ref 96–106)
Creatinine, Ser: 0.92 mg/dL (ref 0.57–1.00)
Globulin, Total: 2.9 g/dL (ref 1.5–4.5)
Glucose: 116 mg/dL — ABNORMAL HIGH (ref 70–99)
Potassium: 3.9 mmol/L (ref 3.5–5.2)
Sodium: 141 mmol/L (ref 134–144)
Total Protein: 7.2 g/dL (ref 6.0–8.5)
eGFR: 65 mL/min/{1.73_m2} (ref >59)

## 2023-06-01 LAB — LIPID PANEL
Chol/HDL Ratio: 2 {ratio} (ref 0.0–4.4)
Cholesterol, Total: 197 mg/dL (ref 100–199)
HDL: 101 mg/dL (ref >39)
LDL Chol Calc (NIH): 78 mg/dL (ref 0–99)
Triglycerides: 103 mg/dL (ref 0–149)
VLDL Cholesterol Cal: 18 mg/dL (ref 5–40)

## 2023-06-01 LAB — T4, FREE: Free T4: 1.41 ng/dL (ref 0.82–1.77)

## 2023-06-01 LAB — TSH: TSH: 3.28 u[IU]/mL (ref 0.450–4.500)

## 2023-06-07 ENCOUNTER — Encounter: Payer: Self-pay | Admitting: "Endocrinology

## 2023-06-07 ENCOUNTER — Ambulatory Visit: Payer: Medicare PPO | Admitting: "Endocrinology

## 2023-06-07 VITALS — BP 114/68 | HR 60 | Ht 65.0 in | Wt 250.8 lb

## 2023-06-07 DIAGNOSIS — Z7985 Long-term (current) use of injectable non-insulin antidiabetic drugs: Secondary | ICD-10-CM | POA: Diagnosis not present

## 2023-06-07 DIAGNOSIS — E782 Mixed hyperlipidemia: Secondary | ICD-10-CM | POA: Diagnosis not present

## 2023-06-07 DIAGNOSIS — E890 Postprocedural hypothyroidism: Secondary | ICD-10-CM

## 2023-06-07 DIAGNOSIS — I10 Essential (primary) hypertension: Secondary | ICD-10-CM

## 2023-06-07 DIAGNOSIS — E119 Type 2 diabetes mellitus without complications: Secondary | ICD-10-CM | POA: Diagnosis not present

## 2023-06-07 DIAGNOSIS — Z7984 Long term (current) use of oral hypoglycemic drugs: Secondary | ICD-10-CM | POA: Diagnosis not present

## 2023-06-07 LAB — POCT GLYCOSYLATED HEMOGLOBIN (HGB A1C): HbA1c, POC (controlled diabetic range): 6.5 % (ref 0.0–7.0)

## 2023-06-07 LAB — POCT UA - MICROALBUMIN
Creatinine, POC: 300 mg/dL
Microalbumin Ur, POC: 8 mg/L

## 2023-06-07 MED ORDER — OZEMPIC (0.25 OR 0.5 MG/DOSE) 2 MG/3ML ~~LOC~~ SOPN: 0.2500 mg | PEN_INJECTOR | SUBCUTANEOUS | 1 refills | Status: DC

## 2023-06-07 NOTE — Patient Instructions (Signed)

## 2023-06-07 NOTE — Progress Notes (Signed)
06/07/2023                   Endocrinology follow-up note  Subjective:    Patient ID: Cheyenne Benson, female    DOB: 14-Jul-1946, PCP Benetta Spar, MD   Past Medical History:  Diagnosis Date   Arthritis    Asthma    Diabetes mellitus without complication (HCC)    Dysrhythmia    "used to speed up a little-when my thyroid wa snot right, I'm on Metorpoolol no- no problem"   Hypothyroidism    Past Surgical History:  Procedure Laterality Date   25 GAUGE PARS PLANA VITRECTOMY WITH 20 GAUGE MVR PORT FOR MACULAR HOLE Right 01/22/2013   Procedure: 25 GAUGE PARS PLANA VITRECTOMY WITH 20 GAUGE MVR PORT FOR MACULAR HOLE RIGHT EYE ;  Surgeon: Sherrie George, MD;  Location: [facility] OR;  Service: Ophthalmology;  Laterality: Right;   ABDOMINAL HYSTERECTOMY  1998   AXILLARY SURGERY Right    removes lump and sweet glands   buninectom     BUNIONECTOMY Bilateral    COLONOSCOPY N/A 06/06/2016   Procedure: COLONOSCOPY;  Surgeon: Corbin Ade, MD;  [facility];  Service: Endoscopy;  Laterality: N/A;  8:30 AM   COLONOSCOPY N/A 06/21/2021   Procedure: COLONOSCOPY;  Surgeon: Corbin Ade, MD;  Location: [facility];  Service: Endoscopy;  Laterality: N/A;  1:00   CYSTECTOMY     behind neck--- no anesthesia   DG STYLOID PROCESS / TEMPORAL BONES     EYE SURGERY Bilateral 4/20\14   cataract   GAS/FLUID EXCHANGE Right 01/22/2013   Procedure: GAS/FLUID EXCHANGE;  Surgeon: Sherrie George, MD;  Location: [facility] OR;  Service: Ophthalmology;  Laterality: Right;  [id number]   LASER PHOTO ABLATION Right 01/22/2013   Procedure: LASER PHOTO ABLATION;  Surgeon: Sherrie George, MD;  Location: [facility] OR;  Service: Ophthalmology;  Laterality: Right;  Headscope   MEMBRANE PEEL Right 01/22/2013   Procedure: MEMBRANE PEEL;  Surgeon: Sherrie George, MD;  Location: [facility] OR;  Service: Ophthalmology;  Laterality: Right;   POLYPECTOMY  06/21/2021   Procedure: POLYPECTOMY;  Surgeon: Corbin Ade, MD;   Location: [facility];  Service: Endoscopy;;   SERUM PATCH Right 01/22/2013   Procedure: SERUM PATCH;  Surgeon: Sherrie George, MD;  Location: [facility] OR;  Service: Ophthalmology;  Laterality: Right;   SKIN GRAFT Left    after axillary surgery   TOE SURGERY     left bones removed from 4 and 5 toe, Right foot 5th toe bone removed   Social History   Socioeconomic History   Marital status: Married    Spouse name: Not on file   Number of children: Not on file   Years of education: Not on file   Highest education level: Not on file  Occupational History   Not on file  Tobacco Use   Smoking status: Former    Types: Cigarettes   Smokeless tobacco: Never   Tobacco comments:    maybe smoked a pack a month  Vaping Use   Vaping status: Never Used  Substance and Sexual Activity   Alcohol use: No   Drug use: No   Sexual activity: Not on file  Other Topics Concern   Not on file  Social History Narrative   Not on file   Social Determinants of Health   Financial Resource  Strain: Not on file  Food Insecurity: Not on file  Transportation Needs: Not on file  Physical Activity: Not on file  Stress: Not on file  Social Connections: Not on file   Outpatient Encounter Medications as of 06/07/2023  Medication Sig   Semaglutide,0.25 or 0.5MG /DOS, (OZEMPIC, 0.25 OR 0.5 MG/DOSE,) 2 MG/3ML SOPN Inject 0.25 mg into the skin once a week.   albuterol (VENTOLIN HFA) 108 (90 Base) MCG/ACT inhaler Inhale 2 puffs into the lungs every 6 (six) hours as needed for wheezing or shortness of breath.   amLODipine (NORVASC) 5 MG tablet Take 5 mg by mouth daily.   aspirin EC 81 MG tablet Take 81 mg by mouth daily.   BREO ELLIPTA 200-25 MCG/ACT AEPB 1 puff daily.   Cholecalciferol (VITAMIN D3) 125 MCG (5000 UT) TABS Take 5,000 Units by mouth at bedtime.   COD LIVER OIL PO Take 1 capsule by mouth at bedtime.   Diphenhydramine-APAP (PERCOGESIC) 12.5-325 MG TABS Take 1 tablet by mouth daily as needed (mild pain).    Flaxseed, Linseed, (FLAX SEED OIL PO) Take 1 capsule by mouth at bedtime.   hydrochlorothiazide (HYDRODIURIL) 25 MG tablet Take 25 mg by mouth daily.    ketotifen (ZADITOR) 0.025 % ophthalmic solution Place 1 drop into both eyes 2 (two) times daily.   levothyroxine (SYNTHROID) 112 MCG tablet TAKE 1 TABLET BY MOUTH ONCE DAILY BEFORE BREAKFAST   levothyroxine (SYNTHROID) 112 MCG tablet Take 1 tablet (112 mcg total) by mouth daily before breakfast.   loratadine (CLARITIN) 10 MG tablet Take 10 mg by mouth daily.   Menatetrenone (VITAMIN K2) 100 MCG TABS Take 100 mcg by mouth at bedtime.   metFORMIN (GLUCOPHAGE) 500 MG tablet Take 1 tablet (500 mg total) by mouth daily.   metoprolol tartrate (LOPRESSOR) 50 MG tablet Take 50 mg by mouth daily.    Multiple Vitamin (MULTIVITAMIN WITH MINERALS) TABS Take 1 tablet by mouth daily.   simvastatin (ZOCOR) 20 MG tablet Take 20 mg by mouth at bedtime.   No facility-administered encounter medications on file as of 06/07/2023.   ALLERGIES: Allergies  Allergen Reactions   Lisinopril Anaphylaxis and Swelling   Adhesive [Tape]     redness   Codeine Nausea Only    Upset stomach    Peanut-Containing Drug Products Swelling    Tingling and swelling of the mouth.   Shrimp [Shellfish Allergy] Swelling    Tingling and swelling of the mouth.   VACCINATION STATUS: Immunization History  Administered Date(s) Administered   Influenza-Unspecified 04/13/2018   Moderna Sars-Covid-2 Vaccination 09/24/2019, 10/26/2019    HPI  37--year-old female with medical history of  Graves' disease status post radioactive iodine ablation at approximate age of 47 years.     She is being seen in follow-up for RAI induced hypothyroidism, type 2 diabetes, hyperlipidemia, hypertension.   She is currently on levothyroxine 112 mcg p.o. daily before breakfast. Her previsit labs are consistent with appropriate replacement.  She also remains on low-dose metformin 500 mg p.o. daily at  breakfast.  Her point-of-care A1c remains at 6.5%.      - She has been overweight/obese most of her adult life.  She wishes to achieve some weight loss.  She did not engage with lifestyle medicine.  - She has no new complaints today. She denies cold/heat intolerance, palpitations, tremors, chest pain, shortness of breath. - She has family history of hypothyroidism in her mother.      Review of Systems   Limited as above.  Objective:    BP 114/68   Pulse 60   Ht 5\' 5"  (1.651 m)   Wt 250 lb 12.8 oz (113.8 kg)   BMI 41.74 kg/m   Wt Readings from Last 3 Encounters:  06/07/23 250 lb 12.8 oz (113.8 kg)  12/06/22 245 lb 12.8 oz (111.5 kg)  06/06/22 248 lb 12.8 oz (112.9 kg)      Recent Results (from the past 2160 hour(s))  Comprehensive metabolic panel     Status: Abnormal   Collection Time: 05/31/23  9:05 AM  Result Value Ref Range   Glucose 116 (H) 70 - 99 mg/dL   BUN 18 8 - 27 mg/dL   Creatinine, Ser 6.57 0.57 - 1.00 mg/dL   eGFR 65 >84 ON/GEX/5.28   BUN/Creatinine Ratio 20 12 - 28   Sodium 141 134 - 144 mmol/L   Potassium 3.9 3.5 - 5.2 mmol/L   Chloride 97 96 - 106 mmol/L   CO2 27 20 - 29 mmol/L   Calcium 10.3 8.7 - 10.3 mg/dL   Total Protein 7.2 6.0 - 8.5 g/dL   Albumin 4.3 3.8 - 4.8 g/dL   Globulin, Total 2.9 1.5 - 4.5 g/dL   Bilirubin Total 0.3 0.0 - 1.2 mg/dL   Alkaline Phosphatase 81 44 - 121 IU/L   AST 13 0 - 40 IU/L   ALT 16 0 - 32 IU/L  Lipid panel     Status: None   Collection Time: 05/31/23  9:05 AM  Result Value Ref Range   Cholesterol, Total 197 100 - 199 mg/dL   Triglycerides 413 0 - 149 mg/dL   HDL 244 >01 mg/dL   VLDL Cholesterol Cal 18 5 - 40 mg/dL   LDL Chol Calc (NIH) 78 0 - 99 mg/dL   Chol/HDL Ratio 2.0 0.0 - 4.4 ratio    Comment:                                   T. Chol/HDL Ratio                                             Men  Women                               1/2 Avg.Risk  3.4    3.3                                   Avg.Risk  5.0     4.4                                2X Avg.Risk  9.6    7.1                                3X Avg.Risk 23.4   11.0   TSH     Status: None   Collection Time: 05/31/23  9:05 AM  Result Value Ref Range   TSH 3.280 0.450 - 4.500 uIU/mL  T4, free     Status: None   Collection Time:  05/31/23  9:05 AM  Result Value Ref Range   Free T4 1.41 0.82 - 1.77 ng/dL  HgB W0J     Status: None   Collection Time: 06/07/23 11:08 AM  Result Value Ref Range   Hemoglobin A1C     HbA1c POC (<> result, manual entry)     HbA1c, POC (prediabetic range)     HbA1c, POC (controlled diabetic range) 6.5 0.0 - 7.0 %  POCT UA - Microalbumin     Status: None   Collection Time: 06/07/23 11:09 AM  Result Value Ref Range   Microalbumin Ur, POC 8.0 mg/L   Creatinine, POC 300 mg/dL   Albumin/Creatinine Ratio, Urine, POC 30-300    Lipid Panel     Component Value Date/Time   CHOL 197 05/31/2023 0905   TRIG 103 05/31/2023 0905   HDL 101 05/31/2023 0905   CHOLHDL 2.0 05/31/2023 0905   CHOLHDL 2.3 11/17/2019 0738   LDLCALC 78 05/31/2023 0905   LDLCALC 89 11/17/2019 0738   LABVLDL 18 05/31/2023 0905      Latest Ref Rng & Units 05/31/2023    9:05 AM 11/29/2022    8:41 AM 07/25/2021   12:00 AM  CMP  Glucose 70 - 99 mg/dL 811  914    BUN 8 - 27 mg/dL 18  19  21       Creatinine 0.57 - 1.00 mg/dL 7.82  9.56  0.9      Sodium 134 - 144 mmol/L 141  141  140      Potassium 3.5 - 5.2 mmol/L 3.9  4.1  3.8      Chloride 96 - 106 mmol/L 97  98  100      CO2 20 - 29 mmol/L 27  28  30       Calcium 8.7 - 10.3 mg/dL 21.3  08.6  9.8      Total Protein 6.0 - 8.5 g/dL 7.2  6.8    Total Bilirubin 0.0 - 1.2 mg/dL 0.3  0.2    Alkaline Phos 44 - 121 IU/L 81  89  73      AST 0 - 40 IU/L 13  16    ALT 0 - 32 IU/L 16  17       This result is from an external source.     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  She is advised to  continue levothyroxine 112 mcg p.o. daily before breakfast.    - We discussed about the correct intake of her thyroid hormone, on empty stomach at fasting, with water, separated by at least 30 minutes from breakfast and other medications,  and separated by more than 4 hours from calcium, iron, multivitamins, acid reflux medications (PPIs). -Patient is made aware of the fact that thyroid hormone replacement is needed for life, dose to be adjusted by periodic monitoring of thyroid function tests.  2. Diabetes mellitus without complication (HCC) -Her point-of-care A1c is 6.5%.  She is advised to continue metformin 500 mg p.o. daily at breakfast.  At this time, patient may benefit from GLP-1 receptor agonists.  I discussed and prescribed Ozempic 0.25 mg subcutaneously weekly.  This medication will be advanced if she tolerates.  Side effects and precautions discussed with her. -As an additional approach , she remains to be a good candidate for lifestyle medicine.  - she acknowledges that there is a room for improvement in  her food and drink choices. - Suggestion is made for her to avoid simple carbohydrates  from her diet including Cakes, Sweet Desserts, Ice Cream, Soda (diet and regular), Sweet Tea, Candies, Chips, Cookies, Store Bought Juices, Alcohol , Artificial Sweeteners,  Coffee Creamer, and "Sugar-free" Products, Lemonade. This will help patient to have more stable blood glucose profile and potentially avoid unintended weight gain.  The following Lifestyle Medicine recommendations according to American College of Lifestyle Medicine  [facility]) were discussed and and offered to patient and she  agrees to start the journey:  A. Whole Foods, Plant-Based Nutrition comprising of fruits and vegetables, plant-based proteins, whole-grain carbohydrates was discussed in detail with the patient.   A list for source of those nutrients were also provided to the patient.  Patient will use only water or unsweetened tea  for hydration. B.  The need to stay away from risky substances including alcohol, smoking; obtaining 7 to 9 hours of restorative sleep, at least 150 minutes of moderate intensity exercise weekly, the importance of healthy social connections,  and stress management techniques were discussed. C.  A full color page of  Calorie density of various food groups per pound showing examples of each food groups was provided to the patient.   3. Essential hypertension-controlled to target. Her blood pressure is control to target.  She is advised to continue her current blood pressure medications including amlodipine 5 mg daily, hydrochlorothiazide 25 mg daily, metoprolol 50 mg p.o. daily.   She was taken off of hydralazine in the interval.  4. Hyperlipidemia -Her recent lipid panel showed controlled LDL at 77.    She is benefiting from the statin intervention, advised to continue simvastatin 20 mg p.o. nightly.  Side effects and precautions discussed with her.    - I advised patient to maintain close follow up with Benetta Spar, MD for primary care needs.   I spent  26  minutes in the care of the patient today including review of labs from Thyroid Function, CMP, and other relevant labs ; imaging/biopsy records (current and previous including abstractions from other facilities); face-to-face time discussing  her lab results and symptoms, medications doses, her options of short and long term treatment based on the latest standards of care / guidelines;   and documenting the encounter.  Rowe Clack  participated in the discussions, expressed understanding, and voiced agreement with the above plans.  All questions were answered to her satisfaction. she is encouraged to contact clinic should she have any questions or concerns prior to her return visit.  Foot exam was normal on December 06, 2022.  Follow up plan: Return in about 6 months (around 12/06/2023) for F/U with Pre-visit Labs, A1c  -NV.  Marquis Lunch, MD Phone: [phone]  Fax: [phone]  This note was partially dictated with voice recognition software. Similar sounding words can be transcribed inadequately or may not  be corrected upon review.  06/07/2023, 4:05 PM

## 2023-06-17 DIAGNOSIS — I10 Essential (primary) hypertension: Secondary | ICD-10-CM | POA: Diagnosis not present

## 2023-06-17 DIAGNOSIS — E1165 Type 2 diabetes mellitus with hyperglycemia: Secondary | ICD-10-CM | POA: Diagnosis not present

## 2023-06-21 IMAGING — MG MM DIGITAL SCREENING BILAT W/ TOMO AND CAD
8 series · 8 of 24 positions shown · non-contrast
Comparison: Previous exam(s).

CLINICAL DATA: Screening.

EXAM:
DIGITAL SCREENING BILATERAL MAMMOGRAM WITH TOMOSYNTHESIS AND CAD
TECHNIQUE: Bilateral screening digital craniocaudal and mediolateral oblique
mammograms were obtained. Bilateral screening digital breast
tomosynthesis was performed. The images were evaluated with
computer-aided detection.

[R MLO synth-2D]
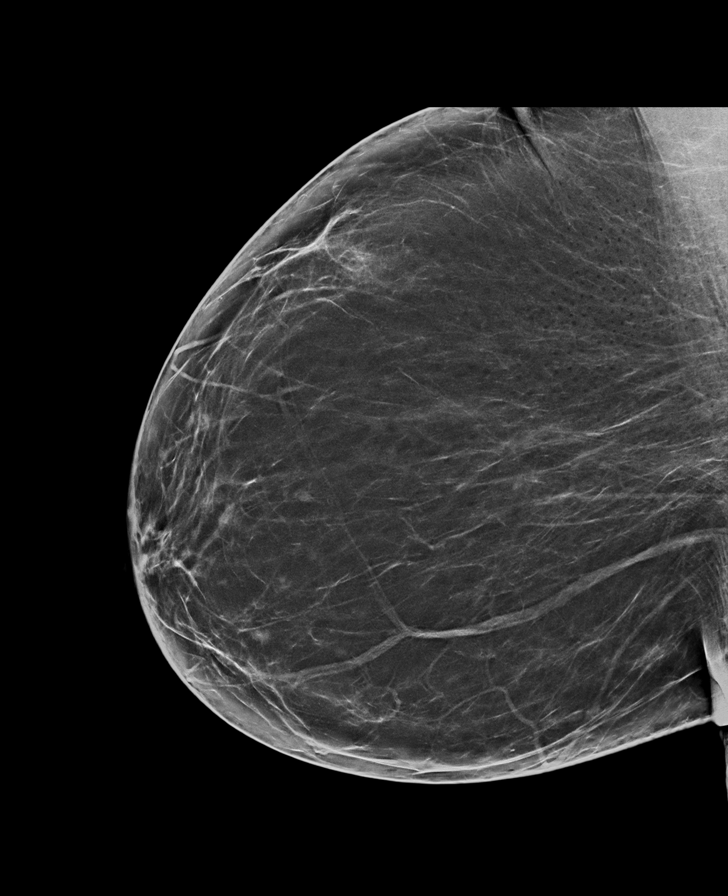

[L MLO synth-2D]
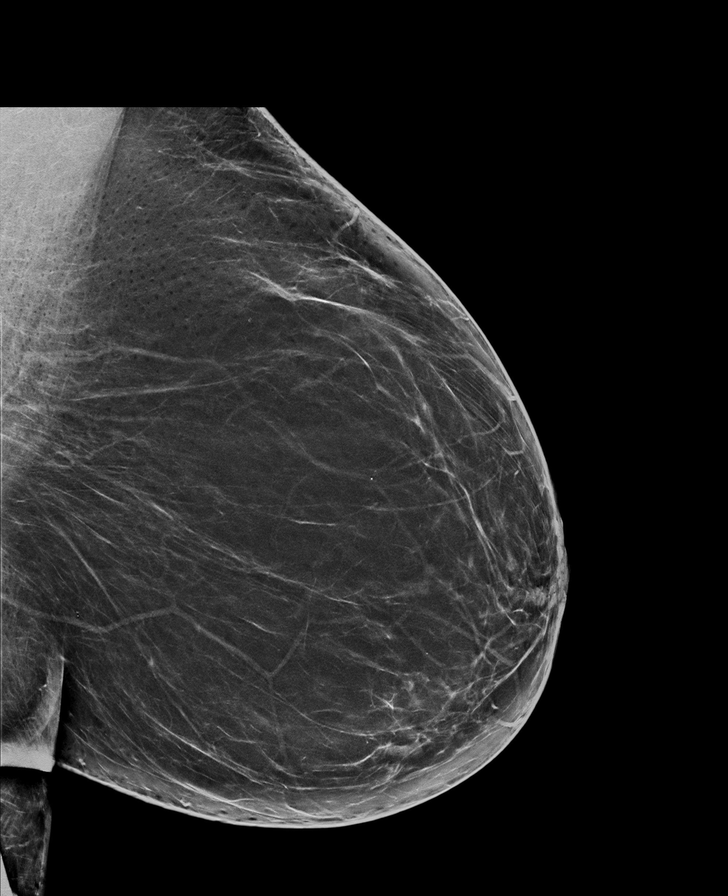

[L CC synth-2D]
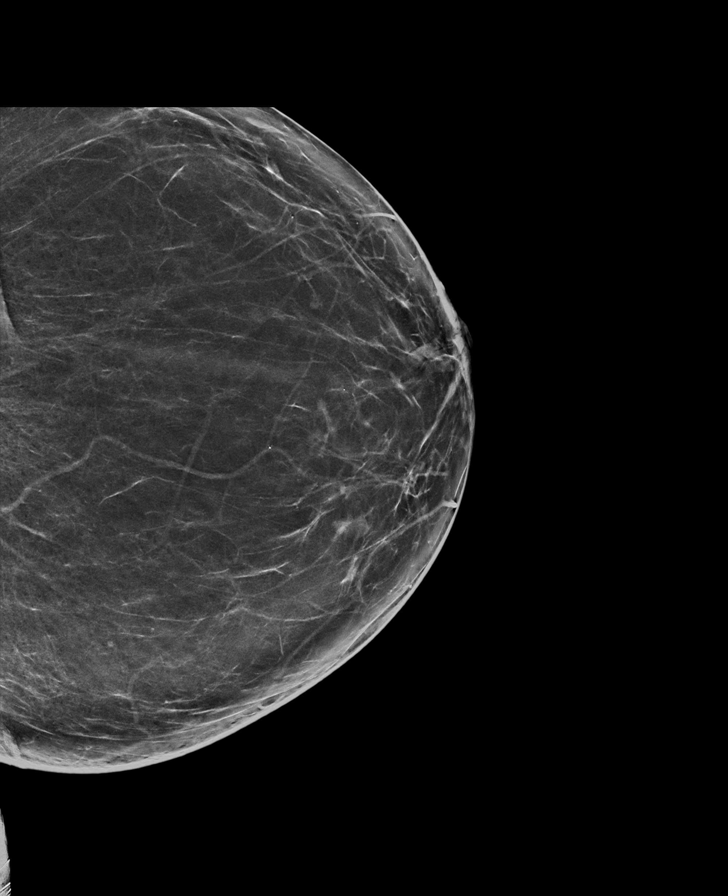

[R CC synth-2D]
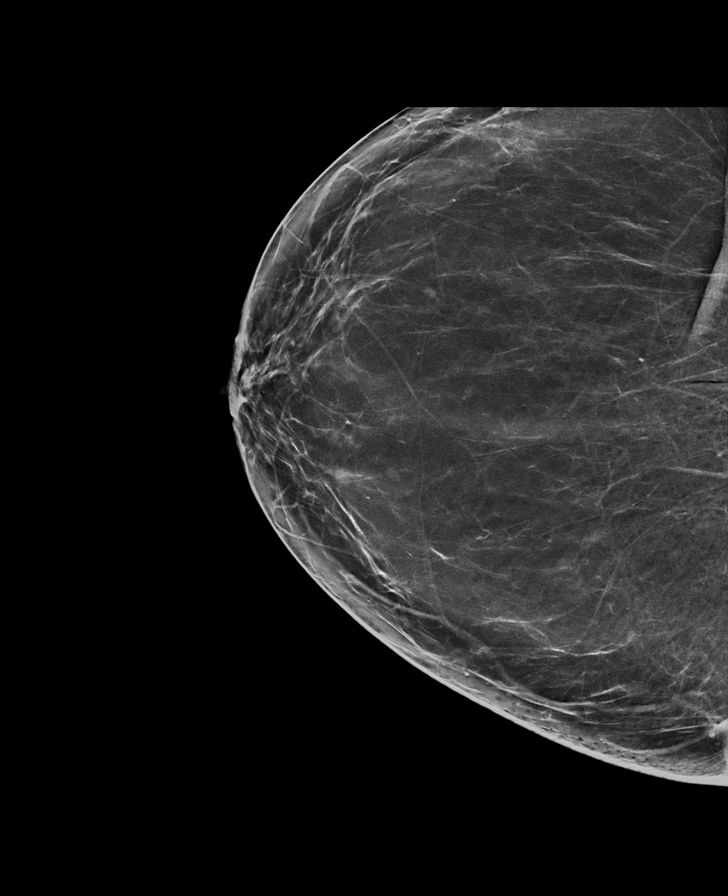

[R MLO tomo · tomo slice 39/77.0]
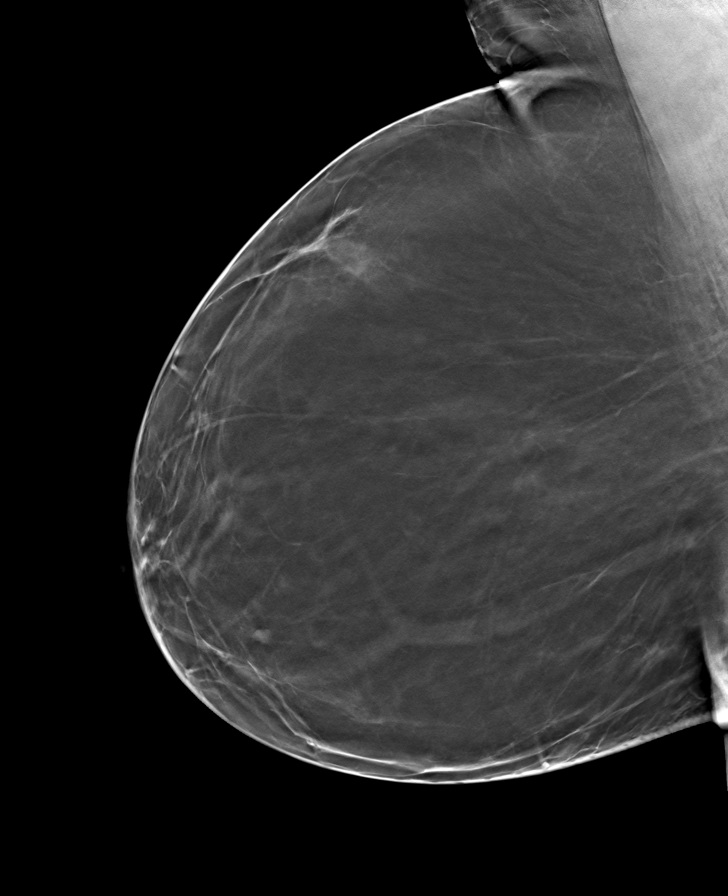

[R CC tomo · tomo slice 40/79.0]
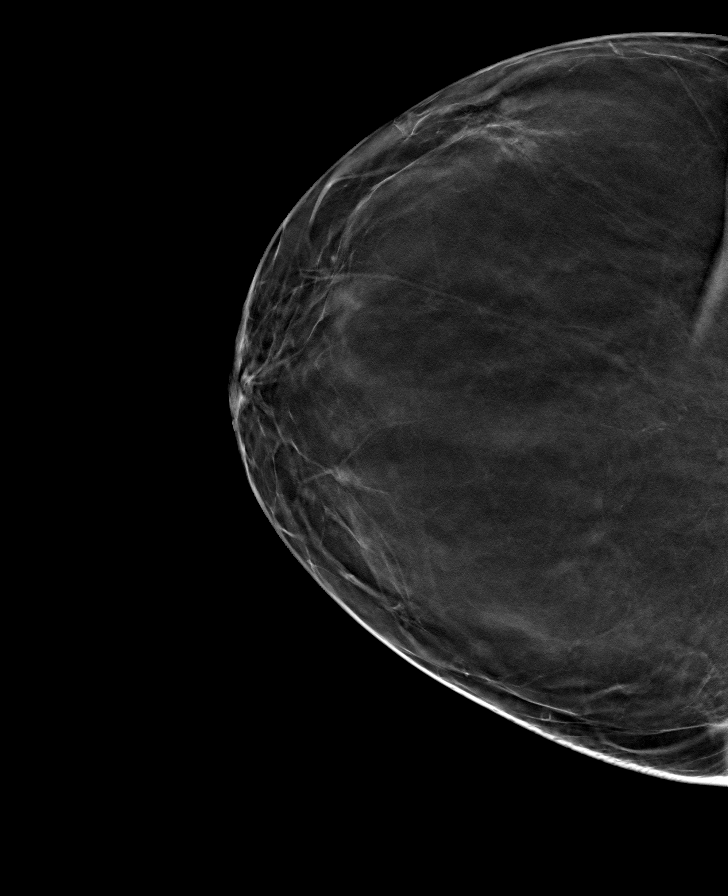

[L MLO tomo · tomo slice 43/85.0]
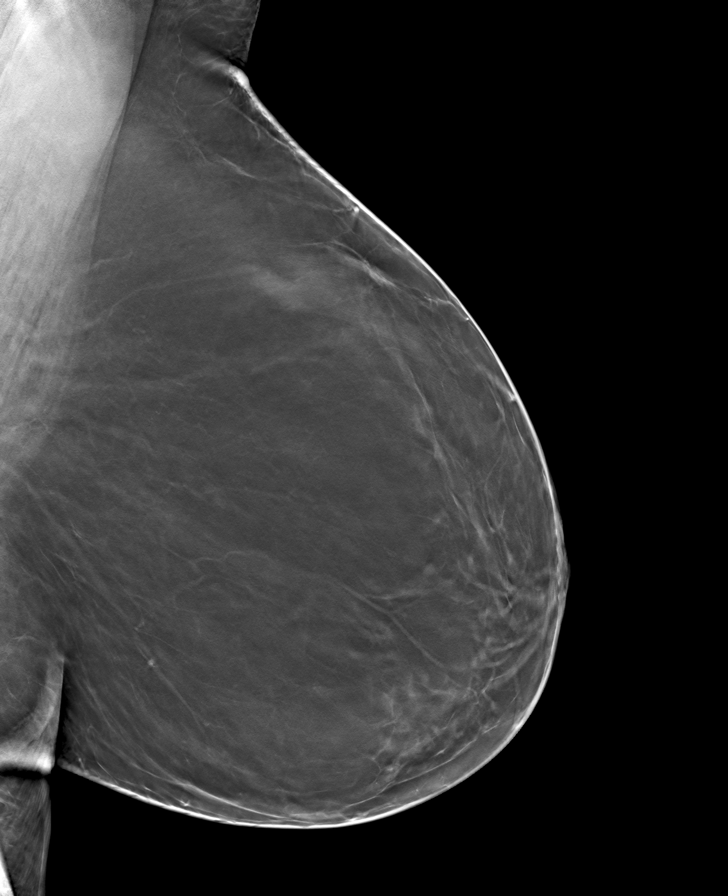

[L CC tomo · tomo slice 41/81.0]
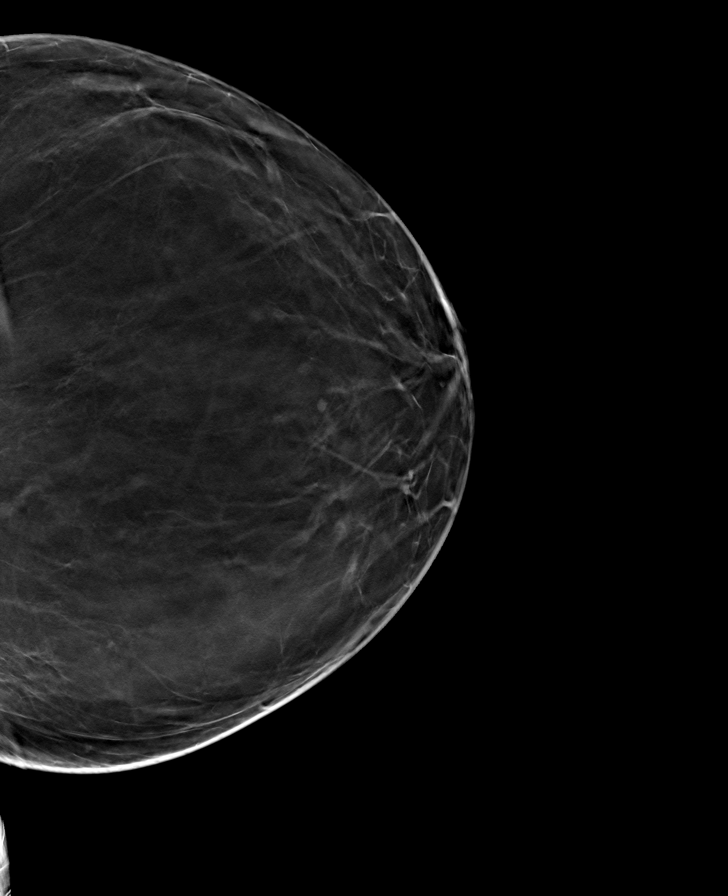

[8 of 24 positions shown; findings below may reference images not displayed]

ACR Breast Density Category b: There are scattered areas of
fibroglandular density.
FINDINGS: There are no findings suspicious for malignancy.
IMPRESSION: No mammographic evidence of malignancy. A result letter of this
screening mammogram will be mailed directly to the patient.

RECOMMENDATION:
Screening mammogram in one year. (Code:51-O-LD2)

BI-RADS CATEGORY  1: Negative.

## 2023-07-17 DIAGNOSIS — Z23 Encounter for immunization: Secondary | ICD-10-CM | POA: Diagnosis not present

## 2023-07-17 DIAGNOSIS — J4540 Moderate persistent asthma, uncomplicated: Secondary | ICD-10-CM | POA: Diagnosis not present

## 2023-07-17 DIAGNOSIS — E039 Hypothyroidism, unspecified: Secondary | ICD-10-CM | POA: Diagnosis not present

## 2023-07-17 DIAGNOSIS — Z1331 Encounter for screening for depression: Secondary | ICD-10-CM | POA: Diagnosis not present

## 2023-07-17 DIAGNOSIS — Z0001 Encounter for general adult medical examination with abnormal findings: Secondary | ICD-10-CM | POA: Diagnosis not present

## 2023-07-17 DIAGNOSIS — E785 Hyperlipidemia, unspecified: Secondary | ICD-10-CM | POA: Diagnosis not present

## 2023-07-17 DIAGNOSIS — Z1389 Encounter for screening for other disorder: Secondary | ICD-10-CM | POA: Diagnosis not present

## 2023-07-17 DIAGNOSIS — I10 Essential (primary) hypertension: Secondary | ICD-10-CM | POA: Diagnosis not present

## 2023-07-18 ENCOUNTER — Other Ambulatory Visit (HOSPITAL_COMMUNITY): Payer: Self-pay | Admitting: Internal Medicine

## 2023-07-18 DIAGNOSIS — Z1231 Encounter for screening mammogram for malignant neoplasm of breast: Secondary | ICD-10-CM

## 2023-07-31 ENCOUNTER — Ambulatory Visit (HOSPITAL_COMMUNITY)
Admission: RE | Admit: 2023-07-31 | Discharge: 2023-07-31 | Disposition: A | Discharge status: Home / Self Care | Payer: Medicare PPO | Source: Ambulatory Visit | Attending: Internal Medicine | Admitting: Internal Medicine

## 2023-07-31 DIAGNOSIS — Z1231 Encounter for screening mammogram for malignant neoplasm of breast: Secondary | ICD-10-CM | POA: Insufficient documentation

## 2023-08-17 DIAGNOSIS — I10 Essential (primary) hypertension: Secondary | ICD-10-CM | POA: Diagnosis not present

## 2023-08-17 DIAGNOSIS — E1165 Type 2 diabetes mellitus with hyperglycemia: Secondary | ICD-10-CM | POA: Diagnosis not present

## 2023-08-22 ENCOUNTER — Telehealth: Payer: Self-pay

## 2023-08-22 NOTE — Telephone Encounter (Signed)
 Left a message requesting pt return call to the office.

## 2023-08-22 NOTE — Telephone Encounter (Signed)
 Pt called stating she has been taking Ozempic 0.25mg  weekly since October without any side effects. Pt inquired about increasing the dose. Please advise.

## 2023-08-27 ENCOUNTER — Other Ambulatory Visit: Payer: Self-pay | Admitting: "Endocrinology

## 2023-08-27 MED ORDER — OZEMPIC (0.25 OR 0.5 MG/DOSE) 2 MG/3ML ~~LOC~~ SOPN: 0.5000 mg | PEN_INJECTOR | SUBCUTANEOUS | 0 refills | Status: DC

## 2023-08-27 NOTE — Telephone Encounter (Signed)
**Note De-identified  [place] Obfuscation** Please advise 

## 2023-08-27 NOTE — Telephone Encounter (Signed)
 Pt made aware

## 2023-09-10 DIAGNOSIS — H43813 Vitreous degeneration, bilateral: Secondary | ICD-10-CM | POA: Diagnosis not present

## 2023-09-17 DIAGNOSIS — E1165 Type 2 diabetes mellitus with hyperglycemia: Secondary | ICD-10-CM | POA: Diagnosis not present

## 2023-09-17 DIAGNOSIS — I10 Essential (primary) hypertension: Secondary | ICD-10-CM | POA: Diagnosis not present

## 2023-10-01 ENCOUNTER — Other Ambulatory Visit: Payer: Self-pay | Admitting: "Endocrinology

## 2023-10-01 DIAGNOSIS — E890 Postprocedural hypothyroidism: Secondary | ICD-10-CM

## 2023-10-15 DIAGNOSIS — I10 Essential (primary) hypertension: Secondary | ICD-10-CM | POA: Diagnosis not present

## 2023-10-15 DIAGNOSIS — E1165 Type 2 diabetes mellitus with hyperglycemia: Secondary | ICD-10-CM | POA: Diagnosis not present

## 2023-11-15 DIAGNOSIS — I10 Essential (primary) hypertension: Secondary | ICD-10-CM | POA: Diagnosis not present

## 2023-11-15 DIAGNOSIS — E1165 Type 2 diabetes mellitus with hyperglycemia: Secondary | ICD-10-CM | POA: Diagnosis not present

## 2023-11-30 ENCOUNTER — Other Ambulatory Visit: Payer: Self-pay | Admitting: "Endocrinology

## 2023-12-15 DIAGNOSIS — I10 Essential (primary) hypertension: Secondary | ICD-10-CM | POA: Diagnosis not present

## 2023-12-15 DIAGNOSIS — E1165 Type 2 diabetes mellitus with hyperglycemia: Secondary | ICD-10-CM | POA: Diagnosis not present

## 2023-12-17 ENCOUNTER — Ambulatory Visit: Payer: Medicare PPO | Admitting: "Endocrinology

## 2024-01-01 DIAGNOSIS — E119 Type 2 diabetes mellitus without complications: Secondary | ICD-10-CM | POA: Diagnosis not present

## 2024-01-01 DIAGNOSIS — E890 Postprocedural hypothyroidism: Secondary | ICD-10-CM | POA: Diagnosis not present

## 2024-01-02 LAB — COMPREHENSIVE METABOLIC PANEL WITH GFR
ALT: 14 IU/L (ref 0–32)
AST: 14 IU/L (ref 0–40)
Albumin: 4.1 g/dL (ref 3.8–4.8)
Alkaline Phosphatase: 89 IU/L (ref 44–121)
BUN/Creatinine Ratio: 27 (ref 12–28)
BUN: 24 mg/dL (ref 8–27)
Bilirubin Total: 0.3 mg/dL (ref 0.0–1.2)
CO2: 25 mmol/L (ref 20–29)
Calcium: 10.2 mg/dL (ref 8.7–10.3)
Chloride: 99 mmol/L (ref 96–106)
Creatinine, Ser: 0.9 mg/dL (ref 0.57–1.00)
Globulin, Total: 3.2 g/dL (ref 1.5–4.5)
Glucose: 116 mg/dL — ABNORMAL HIGH (ref 70–99)
Potassium: 3.5 mmol/L (ref 3.5–5.2)
Sodium: 143 mmol/L (ref 134–144)
Total Protein: 7.3 g/dL (ref 6.0–8.5)
eGFR: 66 mL/min/{1.73_m2} (ref >59)

## 2024-01-02 LAB — T4, FREE: Free T4: 1.54 ng/dL (ref 0.82–1.77)

## 2024-01-02 LAB — TSH: TSH: 0.416 u[IU]/mL — ABNORMAL LOW (ref 0.450–4.500)

## 2024-01-03 ENCOUNTER — Other Ambulatory Visit: Payer: Self-pay | Admitting: "Endocrinology

## 2024-01-03 DIAGNOSIS — E890 Postprocedural hypothyroidism: Secondary | ICD-10-CM

## 2024-01-08 ENCOUNTER — Encounter: Payer: Self-pay | Admitting: "Endocrinology

## 2024-01-08 ENCOUNTER — Ambulatory Visit: Admitting: "Endocrinology

## 2024-01-08 VITALS — BP 118/78 | HR 60 | Ht 65.0 in | Wt 242.2 lb

## 2024-01-08 DIAGNOSIS — E119 Type 2 diabetes mellitus without complications: Secondary | ICD-10-CM | POA: Diagnosis not present

## 2024-01-08 DIAGNOSIS — I10 Essential (primary) hypertension: Secondary | ICD-10-CM | POA: Diagnosis not present

## 2024-01-08 DIAGNOSIS — Z7985 Long-term (current) use of injectable non-insulin antidiabetic drugs: Secondary | ICD-10-CM | POA: Diagnosis not present

## 2024-01-08 DIAGNOSIS — E890 Postprocedural hypothyroidism: Secondary | ICD-10-CM

## 2024-01-08 DIAGNOSIS — Z7984 Long term (current) use of oral hypoglycemic drugs: Secondary | ICD-10-CM | POA: Diagnosis not present

## 2024-01-08 DIAGNOSIS — E782 Mixed hyperlipidemia: Secondary | ICD-10-CM

## 2024-01-08 LAB — POCT GLYCOSYLATED HEMOGLOBIN (HGB A1C): HbA1c, POC (controlled diabetic range): 6.2 % (ref 0.0–7.0)

## 2024-01-08 MED ORDER — SEMAGLUTIDE (1 MG/DOSE) 4 MG/3ML ~~LOC~~ SOPN: 1.0000 mg | PEN_INJECTOR | SUBCUTANEOUS | 1 refills | Status: DC

## 2024-01-08 NOTE — Progress Notes (Signed)
 01/08/2024                   Endocrinology follow-up note  Subjective:    Patient ID: Cheyenne Benson, female    DOB: 1946/03/25, PCP Wyvonna Heidelberg, MD   Past Medical History:  Diagnosis Date   Arthritis    Asthma    Diabetes mellitus without complication (HCC)    Dysrhythmia    "used to speed up a little-when my thyroid  wa snot right, I'm on Metorpoolol no- no problem"   Hypothyroidism    Past Surgical History:  Procedure Laterality Date   25 GAUGE PARS PLANA VITRECTOMY WITH 20 GAUGE MVR PORT FOR MACULAR HOLE Right 01/22/2013   Procedure: 25 GAUGE PARS PLANA VITRECTOMY WITH 20 GAUGE MVR PORT FOR MACULAR HOLE RIGHT EYE ;  Surgeon: Rexene Catching, MD;  Location: [facility] OR;  Service: Ophthalmology;  Laterality: Right;   ABDOMINAL HYSTERECTOMY  1998   AXILLARY SURGERY Right    removes lump and sweet glands   buninectom     BUNIONECTOMY Bilateral    COLONOSCOPY N/A 06/06/2016   Procedure: COLONOSCOPY;  Surgeon: Suzette Espy, MD;  Location: [facility];  Service: Endoscopy;  Laterality: N/A;  8:30 AM   COLONOSCOPY N/A 06/21/2021   Procedure: COLONOSCOPY;  Surgeon: Suzette Espy, MD;  Location: [facility];  Service: Endoscopy;  Laterality: N/A;  1:00   CYSTECTOMY     behind neck--- no anesthesia   DG STYLOID PROCESS / TEMPORAL BONES     EYE SURGERY Bilateral 4/20\14   cataract   GAS/FLUID EXCHANGE Right 01/22/2013   Procedure: GAS/FLUID EXCHANGE;  Surgeon: Rexene Catching, MD;  Location: [facility] OR;  Service: Ophthalmology;  Laterality: Right;  [id number]   LASER PHOTO ABLATION Right 01/22/2013   Procedure: LASER PHOTO ABLATION;  Surgeon: Rexene Catching, MD;  Location: [facility] OR;  Service: Ophthalmology;  Laterality: Right;  Headscope   MEMBRANE PEEL Right 01/22/2013   Procedure: MEMBRANE PEEL;  Surgeon: Rexene Catching, MD;  Location: [facility] OR;  Service: Ophthalmology;  Laterality: Right;   POLYPECTOMY  06/21/2021   Procedure: POLYPECTOMY;  Surgeon: Suzette Espy, MD;   Location: [facility];  Service: Endoscopy;;   SERUM PATCH Right 01/22/2013   Procedure: SERUM PATCH;  Surgeon: Rexene Catching, MD;  Location: [facility] OR;  Service: Ophthalmology;  Laterality: Right;   SKIN GRAFT Left    after axillary surgery   TOE SURGERY     left bones removed from 4 and 5 toe, Right foot 5th toe bone removed   Social History   Socioeconomic History   Marital status: Married    Spouse name: Not on file   Number of children: Not on file   Years of education: Not on file   Highest education level: Not on file  Occupational History   Not on file  Tobacco Use   Smoking status: Former    Types: Cigarettes   Smokeless tobacco: Never   Tobacco comments:    maybe smoked a pack a month  Vaping Use   Vaping status: Never Used  Substance and Sexual Activity   Alcohol  use: No   Drug use: No   Sexual activity: Not on file  Other Topics Concern   Not on file  Social History Narrative   Not on file   Social Drivers of Corporate investment banker  Strain: Not on file  Food Insecurity: Not on file  Transportation Needs: Not on file  Physical Activity: Not on file  Stress: Not on file  Social Connections: Not on file   Outpatient Encounter Medications as of 01/08/2024  Medication Sig   Semaglutide , 1 MG/DOSE, 4 MG/3ML SOPN Inject 1 mg as directed once a week.   albuterol  (VENTOLIN  HFA) 108 (90 Base) MCG/ACT inhaler Inhale 2 puffs into the lungs every 6 (six) hours as needed for wheezing or shortness of breath.   amLODipine (NORVASC) 5 MG tablet Take 5 mg by mouth daily.   aspirin EC 81 MG tablet Take 81 mg by mouth daily.   BREO ELLIPTA 200-25 MCG/ACT AEPB 1 puff daily.   Cholecalciferol (VITAMIN D3) 125 MCG (5000 UT) TABS Take 5,000 Units by mouth at bedtime.   COD LIVER OIL PO Take 1 capsule by mouth at bedtime.   Diphenhydramine-APAP (PERCOGESIC) 12.5-325 MG TABS Take 1 tablet by mouth daily as needed (mild pain).   Flaxseed, Linseed, (FLAX SEED OIL PO) Take 1  capsule by mouth at bedtime.   hydrochlorothiazide  (HYDRODIURIL ) 25 MG tablet Take 25 mg by mouth daily.    ketotifen (ZADITOR) 0.025 % ophthalmic solution Place 1 drop into both eyes 2 (two) times daily.   levothyroxine  (SYNTHROID ) 112 MCG tablet Take 1 tablet (112 mcg total) by mouth daily before breakfast.   levothyroxine  (SYNTHROID ) 112 MCG tablet TAKE 1 TABLET BY MOUTH ONCE DAILY BEFORE BREAKFAST   loratadine (CLARITIN) 10 MG tablet Take 10 mg by mouth daily.   Menatetrenone (VITAMIN K2) 100 MCG TABS Take 100 mcg by mouth at bedtime.   metFORMIN  (GLUCOPHAGE ) 500 MG tablet Take 1 tablet (500 mg total) by mouth daily.   metoprolol  tartrate (LOPRESSOR ) 50 MG tablet Take 50 mg by mouth daily.    Multiple Vitamin (MULTIVITAMIN WITH MINERALS) TABS Take 1 tablet by mouth daily.   simvastatin  (ZOCOR ) 20 MG tablet Take 20 mg by mouth at bedtime.   [DISCONTINUED] OZEMPIC , 0.25 OR 0.5 MG/DOSE, 2 MG/3ML SOPN INJECT 0.5 MG SUBCUTANEOUSLY ONCE A WEEK   No facility-administered encounter medications on file as of 01/08/2024.   ALLERGIES: Allergies  Allergen Reactions   Lisinopril  Anaphylaxis and Swelling   Adhesive [Tape]     redness   Codeine Nausea Only    Upset stomach    Peanut-Containing Drug Products Swelling    Tingling and swelling of the mouth.   Shrimp [Shellfish Allergy] Swelling    Tingling and swelling of the mouth.   VACCINATION STATUS: Immunization History  Administered Date(s) Administered   Influenza-Unspecified 04/13/2018   Moderna Sars-Covid-2 Vaccination 09/24/2019, 10/26/2019    HPI  78--year-old female with medical history of  Graves' disease status post radioactive iodine ablation at approximate age of 60 years.     She is being seen in follow-up for RAI induced hypothyroidism, type 2 diabetes, hyperlipidemia, hypertension.   She is currently on levothyroxine  112 mcg p.o. daily before breakfast.  Her previsit thyroid  function tests are consistent with appropriate  replacement.    In relation to her type 2 diabetes, tolerated Ozempic  at 0.5 mg subcutaneously weekly with no side effects.  She is also on metformin  500 mg p.o. daily at breakfast.  She presents with 8 pounds of weight loss overall, point-of-care A1c 6.2% improving from 6.5%.       - She has been overweight/obese most of her adult life.  She wishes to achieve some weight loss.  She did not engage  with lifestyle medicine.  - She has no new complaints today. She denies cold/heat intolerance, palpitations, tremors, chest pain, shortness of breath. - She has family history of hypothyroidism in her mother.      Review of Systems   Limited as above.  Objective:    BP 118/78   Pulse 60   Ht 5\' 5"  (1.651 m)   Wt 242 lb 3.2 oz (109.9 kg)   BMI 40.30 kg/m   Wt Readings from Last 3 Encounters:  01/08/24 242 lb 3.2 oz (109.9 kg)  06/07/23 250 lb 12.8 oz (113.8 kg)  12/06/22 245 lb 12.8 oz (111.5 kg)      Recent Results (from the past 2160 hours)  TSH     Status: Abnormal   Collection Time: 01/01/24  8:51 AM  Result Value Ref Range   TSH 0.416 (L) 0.450 - 4.500 uIU/mL  T4, free     Status: None   Collection Time: 01/01/24  8:51 AM  Result Value Ref Range   Free T4 1.54 0.82 - 1.77 ng/dL  Comprehensive metabolic panel     Status: Abnormal   Collection Time: 01/01/24  8:51 AM  Result Value Ref Range   Glucose 116 (H) 70 - 99 mg/dL   BUN 24 8 - 27 mg/dL   Creatinine, Ser 1.32 0.57 - 1.00 mg/dL   eGFR 66 >44 WN/UUV/2.53   BUN/Creatinine Ratio 27 12 - 28   Sodium 143 134 - 144 mmol/L   Potassium 3.5 3.5 - 5.2 mmol/L   Chloride 99 96 - 106 mmol/L   CO2 25 20 - 29 mmol/L   Calcium  10.2 8.7 - 10.3 mg/dL   Total Protein 7.3 6.0 - 8.5 g/dL   Albumin 4.1 3.8 - 4.8 g/dL   Globulin, Total 3.2 1.5 - 4.5 g/dL   Bilirubin Total 0.3 0.0 - 1.2 mg/dL   Alkaline Phosphatase 89 44 - 121 IU/L   AST 14 0 - 40 IU/L   ALT 14 0 - 32 IU/L  HgB A1c     Status: None   Collection Time: 01/08/24   9:49 AM  Result Value Ref Range   Hemoglobin A1C     HbA1c POC (<> result, manual entry)     HbA1c, POC (prediabetic range)     HbA1c, POC (controlled diabetic range) 6.2 0.0 - 7.0 %   Lipid Panel     Component Value Date/Time   CHOL 197 05/31/2023 0905   TRIG 103 05/31/2023 0905   HDL 101 05/31/2023 0905   CHOLHDL 2.0 05/31/2023 0905   CHOLHDL 2.3 11/17/2019 0738   LDLCALC 78 05/31/2023 0905   LDLCALC 89 11/17/2019 0738   LABVLDL 18 05/31/2023 0905      Latest Ref Rng & Units 01/01/2024    8:51 AM 05/31/2023    9:05 AM 11/29/2022    8:41 AM  CMP  Glucose 70 - 99 mg/dL 664  403  474   BUN 8 - 27 mg/dL 24  18  19    Creatinine 0.57 - 1.00 mg/dL 2.59  5.63  8.75   Sodium 134 - 144 mmol/L 143  141  141   Potassium 3.5 - 5.2 mmol/L 3.5  3.9  4.1   Chloride 96 - 106 mmol/L 99  97  98   CO2 20 - 29 mmol/L 25  27  28    Calcium  8.7 - 10.3 mg/dL 64.3  32.9  51.8   Total Protein 6.0 - 8.5 g/dL 7.3  7.2  6.8   Total Bilirubin 0.0 - 1.2 mg/dL 0.3  0.3  0.2   Alkaline Phos 44 - 121 IU/L 89  81  89   AST 0 - 40 IU/L 14  13  16    ALT 0 - 32 IU/L 14  16  17       Assessment & Plan:   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  She is advised to continue levothyroxine  112 mcg p.o. daily before breakfast.     - We discussed about the correct intake of her thyroid  hormone, on empty stomach at fasting, with water, separated by at least 30 minutes from breakfast and other medications,  and separated by more than 4 hours from calcium , iron, multivitamins, acid reflux medications (PPIs). -Patient is made aware of the fact that thyroid  hormone replacement is needed for life, dose to be adjusted by periodic monitoring of thyroid  function tests.   2. Diabetes mellitus without complication (HCC) -Her point-of-care A1c is improving to 6.2%.  She will benefit from a higher dose of Ozempic .  I discussed increase Ozempic  to 1 mg  subcutaneously weekly.  Side effects and precautions discussed with her.  This medication will be advanced as she tolerates.  She is also advised to continue metformin  500 mg p.o. daily at breakfast.    -As an additional approach , she remains to be a good candidate for lifestyle medicine.  - she acknowledges that there is a room for improvement in her food and drink choices. - Suggestion is made for her to avoid simple carbohydrates  from her diet including Cakes, Sweet Desserts, Ice Cream, Soda (diet and regular), Sweet Tea, Candies, Chips, Cookies, Store Bought Juices, Alcohol  , Artificial Sweeteners,  Coffee Creamer, and "Sugar-free" Products, Lemonade. This will help patient to have more stable blood glucose profile and potentially avoid unintended weight gain.  The following Lifestyle Medicine recommendations according to American College of Lifestyle Medicine  [facility]) were discussed and and offered to patient and she  agrees to start the journey:  A. Whole Foods, Plant-Based Nutrition comprising of fruits and vegetables, plant-based proteins, whole-grain carbohydrates was discussed in detail with the patient.   A list for source of those nutrients were also provided to the patient.  Patient will use only water or unsweetened tea for hydration. B.  The need to stay away from risky substances including alcohol , smoking; obtaining 7 to 9 hours of restorative sleep, at least 150 minutes of moderate intensity exercise weekly, the importance of healthy social connections,  and stress management techniques were discussed. C.  A full color page of  Calorie density of various food groups per pound showing examples of each food groups was provided to the patient.   3. Essential hypertension  - Her blood pressure is controlled to target.  She is advised to continue her current blood pressure medications including amlodipine 5 mg daily, hydrochlorothiazide  25 mg daily, metoprolol  50 mg p.o. daily.   She  was taken off of hydralazine in the interval.  4. Hyperlipidemia - Her recent lipid panel showed controlled LDL at 78.  She is benefiting from the statin intervention, advised to continue simvastatin  20 mg p.o. nightly.  Side effects and precautions discussed with her.    - I advised patient to maintain close follow up with Fanta, Tesfaye Demissie, MD for primary care needs.    I spent  26  minutes in the care of the  patient today including review of labs from Thyroid  Function, CMP, and other relevant labs ; imaging/biopsy records (current and previous including abstractions from other facilities); face-to-face time discussing  her lab results and symptoms, medications doses, her options of short and long term treatment based on the latest standards of care / guidelines;   and documenting the encounter.  Dara Ear  participated in the discussions, expressed understanding, and voiced agreement with the above plans.  All questions were answered to her satisfaction. she is encouraged to contact clinic should she have any questions or concerns prior to her return visit.   Foot exam was normal on December 06, 2022.  Follow up plan: Return in about 6 months (around 07/10/2024) for F/U with Pre-visit Labs, Meter/CGM/Logs, A1c here, Fasting Labs  in AM B4 8, A1c -NV.  Kalvin Orf, MD Phone: [phone]  Fax: [phone]  This note was partially dictated with voice recognition software. Similar sounding words can be transcribed inadequately or may not  be corrected upon review.  01/08/2024, 10:19 AM

## 2024-01-08 NOTE — Patient Instructions (Signed)

## 2024-01-14 DIAGNOSIS — J4540 Moderate persistent asthma, uncomplicated: Secondary | ICD-10-CM | POA: Diagnosis not present

## 2024-01-14 DIAGNOSIS — E1165 Type 2 diabetes mellitus with hyperglycemia: Secondary | ICD-10-CM | POA: Diagnosis not present

## 2024-01-14 DIAGNOSIS — I10 Essential (primary) hypertension: Secondary | ICD-10-CM | POA: Diagnosis not present

## 2024-01-14 DIAGNOSIS — E039 Hypothyroidism, unspecified: Secondary | ICD-10-CM | POA: Diagnosis not present

## 2024-02-13 DIAGNOSIS — E1165 Type 2 diabetes mellitus with hyperglycemia: Secondary | ICD-10-CM | POA: Diagnosis not present

## 2024-02-13 DIAGNOSIS — I10 Essential (primary) hypertension: Secondary | ICD-10-CM | POA: Diagnosis not present

## 2024-03-10 DIAGNOSIS — H43811 Vitreous degeneration, right eye: Secondary | ICD-10-CM | POA: Diagnosis not present

## 2024-03-15 DIAGNOSIS — I10 Essential (primary) hypertension: Secondary | ICD-10-CM | POA: Diagnosis not present

## 2024-03-15 DIAGNOSIS — E1165 Type 2 diabetes mellitus with hyperglycemia: Secondary | ICD-10-CM | POA: Diagnosis not present

## 2024-03-21 ENCOUNTER — Other Ambulatory Visit: Payer: Self-pay | Admitting: "Endocrinology

## 2024-03-21 DIAGNOSIS — E890 Postprocedural hypothyroidism: Secondary | ICD-10-CM

## 2024-04-15 DIAGNOSIS — I10 Essential (primary) hypertension: Secondary | ICD-10-CM | POA: Diagnosis not present

## 2024-04-15 DIAGNOSIS — E1165 Type 2 diabetes mellitus with hyperglycemia: Secondary | ICD-10-CM | POA: Diagnosis not present

## 2024-05-15 DIAGNOSIS — E1165 Type 2 diabetes mellitus with hyperglycemia: Secondary | ICD-10-CM | POA: Diagnosis not present

## 2024-05-15 DIAGNOSIS — I10 Essential (primary) hypertension: Secondary | ICD-10-CM | POA: Diagnosis not present

## 2024-05-18 ENCOUNTER — Encounter: Payer: Self-pay | Admitting: *Deleted

## 2024-05-18 NOTE — Progress Notes (Signed)
 Cheyenne Benson                                          MRN: [id number]   05/18/2024   The [facility] Quality Team Specialist reviewed this patient medical record for the purposes of chart review for care gap closure. The following were reviewed: chart review for care gap closure-kidney health evaluation for diabetes:eGFR  and uACR.    [facility] Quality Team

## 2024-06-15 DIAGNOSIS — E1165 Type 2 diabetes mellitus with hyperglycemia: Secondary | ICD-10-CM | POA: Diagnosis not present

## 2024-06-15 DIAGNOSIS — I10 Essential (primary) hypertension: Secondary | ICD-10-CM | POA: Diagnosis not present

## 2024-06-19 ENCOUNTER — Other Ambulatory Visit: Payer: Self-pay | Admitting: "Endocrinology

## 2024-06-30 ENCOUNTER — Telehealth: Payer: Self-pay | Admitting: "Endocrinology

## 2024-06-30 NOTE — Telephone Encounter (Signed)
 Spoke with pt regarding microalbumin.

## 2024-06-30 NOTE — Telephone Encounter (Signed)
 Pt said she left a VM yesterday asking for a lab to be added. She is asking for a call back

## 2024-07-08 DIAGNOSIS — E890 Postprocedural hypothyroidism: Secondary | ICD-10-CM | POA: Diagnosis not present

## 2024-07-08 DIAGNOSIS — E119 Type 2 diabetes mellitus without complications: Secondary | ICD-10-CM | POA: Diagnosis not present

## 2024-07-09 LAB — LIPID PANEL
Chol/HDL Ratio: 2.1 ratio (ref 0.0–4.4)
Cholesterol, Total: 198 mg/dL (ref 100–199)
HDL: 94 mg/dL (ref >39)
LDL Chol Calc (NIH): 86 mg/dL (ref 0–99)
Triglycerides: 104 mg/dL (ref 0–149)
VLDL Cholesterol Cal: 18 mg/dL (ref 5–40)

## 2024-07-09 LAB — TSH: TSH: 1.34 u[IU]/mL (ref 0.450–4.500)

## 2024-07-09 LAB — T4, FREE: Free T4: 1.52 ng/dL (ref 0.82–1.77)

## 2024-07-15 ENCOUNTER — Encounter: Payer: Self-pay | Admitting: "Endocrinology

## 2024-07-15 ENCOUNTER — Ambulatory Visit: Admitting: "Endocrinology

## 2024-07-15 VITALS — BP 112/74 | HR 56 | Ht 65.0 in | Wt 239.4 lb

## 2024-07-15 DIAGNOSIS — E782 Mixed hyperlipidemia: Secondary | ICD-10-CM | POA: Diagnosis not present

## 2024-07-15 DIAGNOSIS — Z7985 Long-term (current) use of injectable non-insulin antidiabetic drugs: Secondary | ICD-10-CM | POA: Diagnosis not present

## 2024-07-15 DIAGNOSIS — I10 Essential (primary) hypertension: Secondary | ICD-10-CM | POA: Diagnosis not present

## 2024-07-15 DIAGNOSIS — E119 Type 2 diabetes mellitus without complications: Secondary | ICD-10-CM

## 2024-07-15 DIAGNOSIS — E890 Postprocedural hypothyroidism: Secondary | ICD-10-CM | POA: Diagnosis not present

## 2024-07-15 LAB — POCT GLYCOSYLATED HEMOGLOBIN (HGB A1C): HbA1c, POC (controlled diabetic range): 6.2 % (ref 0.0–7.0)

## 2024-07-15 MED ORDER — SEMAGLUTIDE (2 MG/DOSE) 8 MG/3ML ~~LOC~~ SOPN: 2.0000 mg | PEN_INJECTOR | SUBCUTANEOUS | 1 refills | Status: AC

## 2024-07-15 NOTE — Patient Instructions (Signed)

## 2024-07-15 NOTE — Progress Notes (Signed)
 07/15/2024                   Endocrinology follow-up note  Subjective:    Patient ID: Cheyenne Benson, female    DOB: 1946/02/02, PCP Carlette Benita Area, MD   Past Medical History:  Diagnosis Date   Arthritis    Asthma    Diabetes mellitus without complication (HCC)    Dysrhythmia    used to speed up a little-when my thyroid  wa snot right, I'm on Metorpoolol no- no problem   Hypothyroidism    Past Surgical History:  Procedure Laterality Date   25 GAUGE PARS PLANA VITRECTOMY WITH 20 GAUGE MVR PORT FOR MACULAR HOLE Right 01/22/2013   Procedure: 25 GAUGE PARS PLANA VITRECTOMY WITH 20 GAUGE MVR PORT FOR MACULAR HOLE RIGHT EYE ;  Surgeon: Norleen JONETTA Ku, MD;  Location: [facility] OR;  Service: Ophthalmology;  Laterality: Right;   ABDOMINAL HYSTERECTOMY  1998   AXILLARY SURGERY Right    removes lump and sweet glands   buninectom     BUNIONECTOMY Bilateral    COLONOSCOPY N/A 06/06/2016   Procedure: COLONOSCOPY;  Surgeon: Lamar CHRISTELLA Hollingshead, MD;  Location: [facility];  Service: Endoscopy;  Laterality: N/A;  8:30 AM   COLONOSCOPY N/A 06/21/2021   Procedure: COLONOSCOPY;  Surgeon: Hollingshead Lamar CHRISTELLA, MD;  Location: [facility];  Service: Endoscopy;  Laterality: N/A;  1:00   CYSTECTOMY     behind neck--- no anesthesia   DG STYLOID PROCESS / TEMPORAL BONES     EYE SURGERY Bilateral 4/20\14   cataract   GAS/FLUID EXCHANGE Right 01/22/2013   Procedure: GAS/FLUID EXCHANGE;  Surgeon: Norleen JONETTA Ku, MD;  Location: [facility] OR;  Service: Ophthalmology;  Laterality: Right;  [id number]   LASER PHOTO ABLATION Right 01/22/2013   Procedure: LASER PHOTO ABLATION;  Surgeon: Norleen JONETTA Ku, MD;  Location: [facility] OR;  Service: Ophthalmology;  Laterality: Right;  Headscope   MEMBRANE PEEL Right 01/22/2013   Procedure: MEMBRANE PEEL;  Surgeon: Norleen JONETTA Ku, MD;  Location: [facility] OR;  Service: Ophthalmology;  Laterality: Right;   POLYPECTOMY  06/21/2021   Procedure: POLYPECTOMY;  Surgeon: Hollingshead Lamar CHRISTELLA, MD;   Location: [facility];  Service: Endoscopy;;   SERUM PATCH Right 01/22/2013   Procedure: SERUM PATCH;  Surgeon: Norleen JONETTA Ku, MD;  Location: [facility] OR;  Service: Ophthalmology;  Laterality: Right;   SKIN GRAFT Left    after axillary surgery   TOE SURGERY     left bones removed from 4 and 5 toe, Right foot 5th toe bone removed   Social History   Socioeconomic History   Marital status: Married    Spouse name: Not on file   Number of children: Not on file   Years of education: Not on file   Highest education level: Not on file  Occupational History   Not on file  Tobacco Use   Smoking status: Former    Types: Cigarettes   Smokeless tobacco: Never   Tobacco comments:    maybe smoked a pack a month  Vaping Use   Vaping status: Never Used  Substance and Sexual Activity   Alcohol  use: No   Drug use: No   Sexual activity: Not on file  Other Topics Concern   Not on file  Social History Narrative   Not on file   Social Drivers of Corporate Investment Banker  Strain: Not on file  Food Insecurity: Not on file  Transportation Needs: Not on file  Physical Activity: Not on file  Stress: Not on file  Social Connections: Not on file   Outpatient Encounter Medications as of 07/15/2024  Medication Sig   Semaglutide , 2 MG/DOSE, 8 MG/3ML SOPN Inject 2 mg as directed once a week.   albuterol  (VENTOLIN  HFA) 108 (90 Base) MCG/ACT inhaler Inhale 2 puffs into the lungs every 6 (six) hours as needed for wheezing or shortness of breath.   amLODipine (NORVASC) 5 MG tablet Take 5 mg by mouth daily.   aspirin EC 81 MG tablet Take 81 mg by mouth daily.   BREO ELLIPTA 200-25 MCG/ACT AEPB 1 puff daily.   Cholecalciferol (VITAMIN D3) 125 MCG (5000 UT) TABS Take 5,000 Units by mouth at bedtime.   COD LIVER OIL PO Take 1 capsule by mouth at bedtime.   Diphenhydramine-APAP (PERCOGESIC) 12.5-325 MG TABS Take 1 tablet by mouth daily as needed (mild pain).   Flaxseed, Linseed, (FLAX SEED OIL PO) Take 1  capsule by mouth at bedtime.   hydrochlorothiazide  (HYDRODIURIL ) 25 MG tablet Take 25 mg by mouth daily.    ketotifen (ZADITOR) 0.025 % ophthalmic solution Place 1 drop into both eyes 2 (two) times daily.   levothyroxine  (SYNTHROID ) 112 MCG tablet Take 1 tablet (112 mcg total) by mouth daily before breakfast.   levothyroxine  (SYNTHROID ) 112 MCG tablet TAKE 1 TABLET BY MOUTH ONCE DAILY BEFORE BREAKFAST   loratadine (CLARITIN) 10 MG tablet Take 10 mg by mouth daily.   Menatetrenone (VITAMIN K2) 100 MCG TABS Take 100 mcg by mouth at bedtime.   metoprolol  tartrate (LOPRESSOR ) 50 MG tablet Take 50 mg by mouth daily.    Multiple Vitamin (MULTIVITAMIN WITH MINERALS) TABS Take 1 tablet by mouth daily.   simvastatin  (ZOCOR ) 20 MG tablet Take 20 mg by mouth at bedtime.   [DISCONTINUED] metFORMIN  (GLUCOPHAGE ) 500 MG tablet Take 1 tablet (500 mg total) by mouth daily.   [DISCONTINUED] OZEMPIC , 1 MG/DOSE, 4 MG/3ML SOPN INJECT 1 MG SUBCUTANEOUSLY ONCE A WEEK AS DIRECTED   No facility-administered encounter medications on file as of 07/15/2024.   ALLERGIES: Allergies  Allergen Reactions   Lisinopril  Anaphylaxis and Swelling   Adhesive [Tape]     redness   Codeine Nausea Only    Upset stomach    Peanut-Containing Drug Products Swelling    Tingling and swelling of the mouth.   Shrimp [Shellfish Allergy] Swelling    Tingling and swelling of the mouth.   VACCINATION STATUS: Immunization History  Administered Date(s) Administered   Influenza-Unspecified 04/13/2018   Moderna Sars-Covid-2 Vaccination 09/24/2019, 10/26/2019    HPI  78--year-old female with medical history of  Graves' disease status post radioactive iodine ablation at approximate age of 23 years.     She is being seen in follow-up for RAI induced hypothyroidism, type 2 diabetes, hyperlipidemia, hypertension.   She is currently on levothyroxine  112 mcg p.o. daily before breakfast.  Her previsit thyroid  function tests are consistent with  appropriate replacement.     In relation to her type 2 diabetes, tolerated Ozempic  at 1 mg subcutaneously weekly with no side effects.  She is also on metformin  500 mg p.o. daily at breakfast.  She presents with 8 pounds of weight loss overall, point-of-care A1c 6.2% improving from 6.5%.       - She has been overweight/obese most of her adult life.  She wishes to achieve some weight loss.  She did  not engage with lifestyle medicine.  - She has no new complaints today. She denies cold/heat intolerance, palpitations, tremors, chest pain, shortness of breath. - She has family history of hypothyroidism in her mother.      Review of Systems   Limited as above.  Objective:    BP 112/74   Pulse (!) 56   Ht 5' 5 (1.651 m)   Wt 239 lb 6.4 oz (108.6 kg)   BMI 39.84 kg/m   Wt Readings from Last 3 Encounters:  07/15/24 239 lb 6.4 oz (108.6 kg)  01/08/24 242 lb 3.2 oz (109.9 kg)  06/07/23 250 lb 12.8 oz (113.8 kg)      Recent Results (from the past 2160 hours)  Lipid panel     Status: None   Collection Time: 07/08/24  9:50 AM  Result Value Ref Range   Cholesterol, Total 198 100 - 199 mg/dL   Triglycerides 895 0 - 149 mg/dL   HDL 94 >60 mg/dL   VLDL Cholesterol Cal 18 5 - 40 mg/dL   LDL Chol Calc (NIH) 86 0 - 99 mg/dL   Chol/HDL Ratio 2.1 0.0 - 4.4 ratio    Comment:                                   T. Chol/HDL Ratio                                             Men  Women                               1/2 Avg.Risk  3.4    3.3                                   Avg.Risk  5.0    4.4                                2X Avg.Risk  9.6    7.1                                3X Avg.Risk 23.4   11.0   TSH     Status: None   Collection Time: 07/08/24  9:50 AM  Result Value Ref Range   TSH 1.340 0.450 - 4.500 uIU/mL  T4, free     Status: None   Collection Time: 07/08/24  9:50 AM  Result Value Ref Range   Free T4 1.52 0.82 - 1.77 ng/dL  HgB J8r     Status: None   Collection Time:  07/15/24 10:32 AM  Result Value Ref Range   Hemoglobin A1C     HbA1c POC (<> result, manual entry)     HbA1c, POC (prediabetic range)     HbA1c, POC (controlled diabetic range) 6.2 0.0 - 7.0 %   Lipid Panel     Component Value Date/Time   CHOL 198 07/08/2024 0950   TRIG 104 07/08/2024 0950   HDL 94 07/08/2024 0950   CHOLHDL 2.1 07/08/2024 0950   CHOLHDL 2.3  11/17/2019 0738   LDLCALC 86 07/08/2024 0950   LDLCALC 89 11/17/2019 0738   LABVLDL 18 07/08/2024 0950      Latest Ref Rng & Units 01/01/2024    8:51 AM 05/31/2023    9:05 AM 11/29/2022    8:41 AM  CMP  Glucose 70 - 99 mg/dL 883  883  867   BUN 8 - 27 mg/dL 24  18  19    Creatinine 0.57 - 1.00 mg/dL 9.09  9.07  9.08   Sodium 134 - 144 mmol/L 143  141  141   Potassium 3.5 - 5.2 mmol/L 3.5  3.9  4.1   Chloride 96 - 106 mmol/L 99  97  98   CO2 20 - 29 mmol/L 25  27  28    Calcium  8.7 - 10.3 mg/dL 89.7  89.6  89.8   Total Protein 6.0 - 8.5 g/dL 7.3  7.2  6.8   Total Bilirubin 0.0 - 1.2 mg/dL 0.3  0.3  0.2   Alkaline Phos 44 - 121 IU/L 89  81  89   AST 0 - 40 IU/L 14  13  16    ALT 0 - 32 IU/L 14  16  17       Assessment & Plan:   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  She is advised to continue levothyroxine  112 mcg p.o. daily before breakfast.     - We discussed about the correct intake of her thyroid  hormone, on empty stomach at fasting, with water, separated by at least 30 minutes from breakfast and other medications,  and separated by more than 4 hours from calcium , iron, multivitamins, acid reflux medications (PPIs). -Patient is made aware of the fact that thyroid  hormone replacement is needed for life, dose to be adjusted by periodic monitoring of thyroid  function tests.   2. Diabetes mellitus without complication (HCC) -Her point-of-care A1c is 6.2%.  She is tolerating Ozempic , discussed and increased her dose to 2 mg subcutaneously weekly.,   She is advised to hold off metformin .   -As an additional approach , she remains to be a good candidate for lifestyle medicine.  - she acknowledges that there is a room for improvement in her food and drink choices. - Suggestion is made for her to avoid simple carbohydrates  from her diet including Cakes, Sweet Desserts, Ice Cream, Soda (diet and regular), Sweet Tea, Candies, Chips, Cookies, Store Bought Juices, Alcohol  , Artificial Sweeteners,  Coffee Creamer, and Sugar-free Products, Lemonade. This will help patient to have more stable blood glucose profile and potentially avoid unintended weight gain.   3. Essential hypertension  - Her blood pressure is controlled to target.  She is advised to continue her current blood pressure medications including amlodipine 5 mg daily, hydrochlorothiazide  25 mg daily, metoprolol  50 mg p.o. daily.   She was taken off of hydralazine in the interval.  4. Hyperlipidemia - Her recent lipid panel showed controlled LDL at 78.  She is benefiting from statin intervention, advised to continue simvastatin  20 mg p.o. nightly.    Side effects and precautions discussed with her.    - I advised patient to maintain close follow up with Fanta, Tesfaye Demissie, MD for primary care needs.   I spent  26  minutes in the care of the patient today including review of labs from Thyroid  Function, CMP, and other relevant labs ; imaging/biopsy records (current and previous including abstractions from other  facilities); face-to-face time discussing  her lab results and symptoms, medications doses, her options of short and long term treatment based on the latest standards of care / guidelines;   and documenting the encounter.  Foy JAYSON Batter  participated in the discussions, expressed understanding, and voiced agreement with the above plans.  All questions were answered to her satisfaction. she is encouraged to contact clinic should she have any questions or concerns prior to  her return visit.   Foot exam was normal on July 15, 2024.  Follow up plan: Return in about 6 months (around 01/13/2025) for F/U with Pre-visit Labs, A1c -NV.  Ranny Earl, MD Phone: [phone]  Fax: [phone]  This note was partially dictated with voice recognition software. Similar sounding words can be transcribed inadequately or may not  be corrected upon review.  07/15/2024, 11:17 AM

## 2024-07-16 DIAGNOSIS — Z1389 Encounter for screening for other disorder: Secondary | ICD-10-CM | POA: Diagnosis not present

## 2024-07-16 DIAGNOSIS — I10 Essential (primary) hypertension: Secondary | ICD-10-CM | POA: Diagnosis not present

## 2024-07-16 DIAGNOSIS — J4540 Moderate persistent asthma, uncomplicated: Secondary | ICD-10-CM | POA: Diagnosis not present

## 2024-07-16 DIAGNOSIS — Z0001 Encounter for general adult medical examination with abnormal findings: Secondary | ICD-10-CM | POA: Diagnosis not present

## 2024-07-16 DIAGNOSIS — E785 Hyperlipidemia, unspecified: Secondary | ICD-10-CM | POA: Diagnosis not present

## 2024-07-16 DIAGNOSIS — Z1331 Encounter for screening for depression: Secondary | ICD-10-CM | POA: Diagnosis not present

## 2024-07-16 DIAGNOSIS — E039 Hypothyroidism, unspecified: Secondary | ICD-10-CM | POA: Diagnosis not present

## 2024-07-16 DIAGNOSIS — E1165 Type 2 diabetes mellitus with hyperglycemia: Secondary | ICD-10-CM | POA: Diagnosis not present

## 2025-01-14 ENCOUNTER — Ambulatory Visit: Admitting: "Endocrinology
